# Patient Record
Sex: Female | Born: 1935 | Race: White | Hispanic: No | Smoking: Never smoker
Health system: Southern US, Community
[De-identification: ages and names within clinical notes are randomized; demographics above are authoritative.]

## PROBLEM LIST (undated history)

## (undated) DIAGNOSIS — C801 Malignant (primary) neoplasm, unspecified: Secondary | ICD-10-CM

## (undated) DIAGNOSIS — B019 Varicella without complication: Secondary | ICD-10-CM

## (undated) DIAGNOSIS — K219 Gastro-esophageal reflux disease without esophagitis: Secondary | ICD-10-CM

## (undated) DIAGNOSIS — C4490 Unspecified malignant neoplasm of skin, unspecified: Secondary | ICD-10-CM

## (undated) HISTORY — PX: OTHER SURGICAL HISTORY: SHX169

---

## 1996-01-28 HISTORY — PX: BREAST BIOPSY: SHX20

## 2004-02-02 ENCOUNTER — Ambulatory Visit: Payer: Self-pay | Admitting: Internal Medicine

## 2005-02-13 ENCOUNTER — Ambulatory Visit: Payer: Self-pay | Admitting: Internal Medicine

## 2006-02-16 ENCOUNTER — Ambulatory Visit: Payer: Self-pay | Admitting: Internal Medicine

## 2007-02-18 ENCOUNTER — Ambulatory Visit: Payer: Self-pay | Admitting: Internal Medicine

## 2007-03-26 ENCOUNTER — Ambulatory Visit: Payer: Self-pay | Admitting: Unknown Physician Specialty

## 2007-05-19 ENCOUNTER — Ambulatory Visit: Payer: Self-pay

## 2007-06-18 ENCOUNTER — Other Ambulatory Visit: Payer: Self-pay

## 2007-06-18 ENCOUNTER — Ambulatory Visit: Payer: Self-pay | Admitting: General Practice

## 2007-06-30 ENCOUNTER — Ambulatory Visit: Payer: Self-pay | Admitting: General Practice

## 2008-02-21 ENCOUNTER — Ambulatory Visit: Payer: Self-pay | Admitting: Internal Medicine

## 2009-02-21 ENCOUNTER — Ambulatory Visit: Payer: Self-pay | Admitting: Internal Medicine

## 2010-02-22 ENCOUNTER — Ambulatory Visit: Payer: Self-pay | Admitting: Internal Medicine

## 2010-03-07 ENCOUNTER — Ambulatory Visit: Payer: Self-pay | Admitting: Ophthalmology

## 2010-04-02 ENCOUNTER — Ambulatory Visit: Payer: Self-pay | Admitting: Ophthalmology

## 2011-03-04 ENCOUNTER — Ambulatory Visit: Payer: Self-pay | Admitting: Internal Medicine

## 2011-04-21 ENCOUNTER — Ambulatory Visit: Payer: Self-pay | Admitting: General Practice

## 2011-04-21 LAB — URINALYSIS, COMPLETE
Bilirubin,UR: NEGATIVE
Blood: NEGATIVE
Glucose,UR: NEGATIVE mg/dL (ref 0–75)
Leukocyte Esterase: NEGATIVE
Nitrite: NEGATIVE
Ph: 5 (ref 4.5–8.0)
Protein: NEGATIVE

## 2011-04-21 LAB — BASIC METABOLIC PANEL
BUN: 15 mg/dL (ref 7–18)
Calcium, Total: 9.5 mg/dL (ref 8.5–10.1)
Co2: 28 mmol/L (ref 21–32)
EGFR (African American): >60
EGFR (Non-African Amer.): >60
Glucose: 100 mg/dL — ABNORMAL HIGH (ref 65–99)
Osmolality: 288 (ref 275–301)
Sodium: 144 mmol/L (ref 136–145)

## 2011-04-21 LAB — CBC
HCT: 40.9 % (ref 35.0–47.0)
MCV: 93 fL (ref 80–100)
Platelet: 228 10*3/uL (ref 150–440)
RBC: 4.4 10*6/uL (ref 3.80–5.20)
RDW: 13.6 % (ref 11.5–14.5)

## 2011-04-21 LAB — PROTIME-INR: INR: 0.9

## 2011-04-21 LAB — SEDIMENTATION RATE: Erythrocyte Sed Rate: 17 mm/hr (ref 0–30)

## 2011-04-21 LAB — MRSA PCR SCREENING

## 2011-04-21 LAB — APTT: Activated PTT: 25.5 secs (ref 23.6–35.9)

## 2011-04-23 LAB — URINE CULTURE

## 2011-05-07 ENCOUNTER — Inpatient Hospital Stay: Payer: Self-pay | Admitting: General Practice

## 2011-05-08 LAB — BASIC METABOLIC PANEL
Anion Gap: 7 (ref 7–16)
Calcium, Total: 8.1 mg/dL — ABNORMAL LOW (ref 8.5–10.1)
Chloride: 109 mmol/L — ABNORMAL HIGH (ref 98–107)
Co2: 27 mmol/L (ref 21–32)
EGFR (African American): >60
EGFR (Non-African Amer.): >60
Glucose: 107 mg/dL — ABNORMAL HIGH (ref 65–99)
Potassium: 3.9 mmol/L (ref 3.5–5.1)
Sodium: 143 mmol/L (ref 136–145)

## 2011-05-08 LAB — PLATELET COUNT: Platelet: 191 10*3/uL (ref 150–440)

## 2011-05-09 LAB — BASIC METABOLIC PANEL
Anion Gap: 8 (ref 7–16)
BUN: 7 mg/dL (ref 7–18)
Calcium, Total: 8.3 mg/dL — ABNORMAL LOW (ref 8.5–10.1)
Chloride: 107 mmol/L (ref 98–107)
Co2: 26 mmol/L (ref 21–32)
Creatinine: 0.57 mg/dL — ABNORMAL LOW (ref 0.60–1.30)
EGFR (African American): >60
Osmolality: 280 (ref 275–301)
Sodium: 141 mmol/L (ref 136–145)

## 2012-03-04 ENCOUNTER — Ambulatory Visit: Payer: Self-pay | Admitting: Internal Medicine

## 2012-08-04 ENCOUNTER — Ambulatory Visit: Payer: Self-pay | Admitting: Urology

## 2013-03-21 ENCOUNTER — Ambulatory Visit: Payer: Self-pay | Admitting: Internal Medicine

## 2014-03-22 DIAGNOSIS — I10 Essential (primary) hypertension: Secondary | ICD-10-CM | POA: Insufficient documentation

## 2014-03-23 ENCOUNTER — Ambulatory Visit: Payer: Self-pay | Admitting: Internal Medicine

## 2014-05-21 NOTE — Discharge Summary (Signed)
PATIENT NAME:  Vickie Chaney, Vickie Chaney MR#:  [id number] DATE OF BIRTH:  [birth year]  DATE OF ADMISSION:  05/07/2011 DATE OF DISCHARGE:  05/10/2011  ADMITTING DIAGNOSIS: Degenerative arthrosis of the left knee.   DISCHARGE DIAGNOSIS: Degenerative arthrosis of the left knee.   HISTORY: The patient is a pleasant 80 year old who has been followed at [facility] for progression of left knee pain. She had reported a long history of left knee pain. The patient had previously underwent a left knee arthroscopy for partial medial meniscectomy and chondroplasty of the medial femoral condyle in June 2009. 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 The patient stated that the pain had greatly increased in severity. She had localized most of the pain along the medial aspect of the knee. Her pain was noted to be aggravated with weight-bearing activities. She was noted to ambulate with a limp at times. At the time of surgery, she was not using any ambulatory aid. The patient had not seen any significant improvement in her condition despite activity modification, use of any nonsteroidal anti-inflammatory medications, and intraarticular cortisone injections. The pain had progressed to the point that it was significantly interfering with her activities of daily living. X-rays taken in the [facility] showed narrowing of the medial cartilage space with associated varus alignment. She was noted to have osteophyte as well as subchondral sclerosis. After discussion of the risks and benefits of surgical intervention, the patient expressed her understanding of the risks and benefits and agreed with plans for surgical intervention.   PROCEDURE: Left total knee arthroplasty using computer-assisted navigation.   ANESTHESIA: Femoral nerve block with spinal.   SOFT TISSUE RELEASE: Anterior cruciate ligament, posterior cruciate ligament, deep  medial collateral ligaments, as well as the patellofemoral ligament.   IMPLANTS UTILIZED: DePuy PFC Sigma size 4 narrow posterior stabilized femoral component (cemented), size 3 tibial component (cemented), 35 mm three pegged oval dome patella (cemented), and a 10 mm stabilized rotating platform polyethylene insert.   HOSPITAL COURSE: The patient tolerated the procedure very well. She had no complications. She was then taken to the PAC-U where she was stabilized and then transferred to the orthopedic floor. The patient began receiving anticoagulation therapy of Lovenox 30 mg subcutaneous every 12 hours per anesthesia and pharmacy protocol. She was fitted with TED stockings bilaterally. These were allowed to be removed one hour per eight hour shift. She was also fitted with the AV-I compression foot pumps set at 80 mmHg. Her calves have been nontender and free of any evidence of any deep venous thromboses. Negative Homans sign. Heels were elevated off the bed using rolled towels.   The patient has denied any chest pain or shortness of breath. She has been afebrile. Hemodynamically she was stable and no transfusions were given other than the Autovac transfusions given the first six hours postoperatively.   Physical therapy was initiated on day one for gait training and transfers. The patient was ambulating greater than 200 feet upon discharge. She was able to go up and down four sets of steps. She was independent with bed to chair transfers. Occupational therapy was also initiated on day one for activities of daily living and assistive devices. No complications were encountered during the hospital course.   The patient's IV, Foley and Hemovac were all discontinued on day two along with a dressing change. The Polar Care was reapplied to the surgical leg maintaining a temperature of  40 to 50 degrees Fahrenheit. The wound has been free of any drainage or any signs of infection. The patient was able to do  straight leg raises on her own.   The patient was discharged to home in improved stable condition.   DISCHARGE INSTRUCTIONS:  1. She is to weight bear as tolerated.  2. Continue using TED stockings. These are to be worn during the day but may be removed at night.  3. She will receive home health physical therapy. Continue using a walker until cleared by physical therapy to go to a quad cane.  4. She is placed on a regular diet.  5. She has a follow-up appointment in the clinic in two weeks. She is to call the clinic sooner if any temperatures of 101.5 or excessive bleeding.  6. She is to resume her regular medication that she was on prior to admission. She was given a prescription for Lovenox 40 mg subcutaneously daily for 14 days then discontinue and begin taking one 81 mg enteric-coated aspirin per day. She was also given a prescription for oxycodone 5 to 10 mg every 4 to 6 hours p.r.n. for pain.  7. Continue using Polar Care maintaining a temperature of 40 to 50 feet degrees Fahrenheit.   PAST MEDICAL HISTORY:  1. Chickenpox. 2. Arthritis of the feet. 3. Seasonal allergies.  4. Hypercholesterolemia.  5. Gastroesophageal reflux disease.  ____________________________ Vance Peper, PA jrw:slb D: 05/18/2011 20:18:21 ET T: 05/19/2011 10:49:02 ET JOB#: [id number]  cc: Vance Peper, PA, <Dictator> Narely Nobles PA ELECTRONICALLY SIGNED 05/19/2011 20:33

## 2014-05-21 NOTE — Op Note (Signed)
PATIENT NAME:  Vickie Chaney, Vickie Chaney MR#:  [id number] DATE OF BIRTH:  [birth year]  DATE OF PROCEDURE:  05/07/2011  PREOPERATIVE DIAGNOSIS: Degenerative arthrosis of the left knee.   POSTOPERATIVE DIAGNOSIS: Degenerative arthrosis of the left knee.   PROCEDURE PERFORMED: Left total knee arthroplasty using computer-assisted navigation.   SURGEON: Laurice Record. Holley Bouche., MD  ASSISTANT: Vance Peper, PA-C (required to maintain retraction throughout the procedure)   ANESTHESIA: Femoral nerve block and spinal.   ESTIMATED BLOOD LOSS: 50 mL.   FLUIDS REPLACED: 2000 mL of crystalloid.   TOURNIQUET TIME: 71 minutes.   DRAINS: Two medium drains to reinfusion system.   SOFT TISSUE RELEASES: Anterior cruciate ligament, posterior cruciate ligament, deep medial collateral ligament, and patellofemoral ligament.   IMPLANTS UTILIZED: DePuy PFC Sigma size 4N (narrow) posterior stabilized femoral component (cemented), size 3 tibial component (cemented), 35 mm three peg oval dome patella (cemented), and a 10 mm stabilized rotating platform polyethylene insert.   INDICATIONS FOR SURGERY: The patient is a 80 year old female who has been seen for complaints of progressive left knee pain. X-rays demonstrated severe degenerative changes in tricompartmental fashion with relative varus deformity. After discussion of the risks and benefits of surgical intervention, patient expressed her understanding of the risks and benefits and agreed with plans for surgical intervention.   PROCEDURE IN DETAIL: The patient was brought into the Operating Room and, after adequate femoral nerve block and spinal anesthesia was achieved, a tourniquet was placed on patient's upper left thigh. Patient's left knee and leg were cleaned and prepped with alcohol and DuraPrep, draped in the usual sterile fashion. A "timeout" was performed as per usual protocol. The left lower extremity was exsanguinated using Esmarch, and tourniquet was inflated to 300  mmHg. Anterior longitudinal incision was made followed by standard mid vastus approach. A moderate effusion was evacuated. Deep fibers of the medial collateral ligament were elevated in subperiosteal fashion off the medial flare of the tibia so as to maintain a continuous soft tissue sleeve. Patella was subluxed laterally and the patellofemoral ligament was incised. Inspection of the knee demonstrated severe degenerative changes in tricompartmental fashion. Prominent osteophytes were debrided using rongeur. Anterior and posterior cruciate ligaments were excised. Two 4.0 mm Schanz pins were inserted into the femur and into the tibia for attachment of the array of spheres used for computer-assisted navigation. Hip center was identified using circumduction technique. Distal landmarks were mapped using computer. Distal femur and proximal tibia were mapped using the computer. Distal femoral cutting guide was positioned using computer-assisted navigation so as to achieve a 5 degrees distal valgus cut. Cut was performed and verified using computer. Distal femur was sized and it was felt that a size 4N (narrow) posterior stabilized femoral component was appropriate. A size 4 cutting guide was positioned using computer-assisted navigation and the anterior cut was performed. This was followed by completion of the posterior and chamfer cuts. Femoral cutting guide for the central box was then positioned and central box cut was performed.   Attention was then directed to the proximal tibia. Medial and lateral menisci were excised. The extramedullary tibial cutting guide was positioned using computer-assisted navigation so as to achieve 0 degrees varus valgus alignment and 0 degrees posterior slope. Cut was performed and verified using computer. Proximal tibia was sized and it was felt that a size 3 tibial tray was appropriate. Tibial and femoral trials were inserted followed by insertion of a 10 mm polyethylene trial. This  allowed for excellent mediolateral soft tissue balancing  both in full extension and in 90 degrees of flexion. Finally, patella was cut and prepared so as to accommodate a 35 mm three peg oval dome patella. Patellar trial was placed and the knee was placed through a range of motion with excellent patellar tracking appreciated.   Femoral trial was removed. Central post hole for the tibial component was reamed followed by insertion of a keel punch. Tibial trial was then removed. Cut surfaces of bone were irrigated with copious amounts of normal saline with antibiotic solution using pulsatile lavage and then suctioned dry. Polymethyl methacrylate cement was prepared in the usual fashion using a vacuum mixer. Cement was applied to the cut surface of the proximal tibia as well as along the undersurface of a size 3 MBT tibial component. Tibial component was positioned and impacted into place. Excess cement was removed using freer elevators. Cement was then applied to the cut surface of the femur as well as along the posterior flanges of size 4N (narrow) posterior stabilized femoral component. Femoral component was positioned and impacted into place. Excess cement was removed using freer elevators. A 10 mm stabilized rotating platform polyethylene insert was inserted and the knee was brought into full extension with steady axial compression applied. Finally, cement was applied to the backside of a 35 mm three peg oval dome patella and patellar component was positioned and patellar clamp applied. Excess cement was removed using freer elevators.   After adequate curing of cement, tourniquet was deflated after total tourniquet time of 71 minutes. Hemostasis was achieved using electrocautery. The knee was irrigated with copious amounts of normal saline with antibiotic solution using pulsatile lavage and then suctioned dry. The knee was inspected for any residual cement debris. A total of 30 mL of 0.25% Marcaine with  epinephrine was injected along the posterior capsule. A 10 mm stabilized rotating platform polyethylene insert was inserted and the knee was placed through a range of motion. Excellent mediolateral soft tissue balancing was appreciated both in full extension and in 90 degrees of flexion. Excellent patellar tracking was appreciated. Two medium drains were placed in the wound bed and brought out through a separate stab incision to be attached to a reinfusion system. The medial parapatellar portion of the incision was reapproximated using interrupted sutures of #1 Vicryl. Subcutaneous tissue was approximated in layers using first #0 Vicryl followed by 2-0 Vicryl. The skin was closed with skin staples. A sterile dressing was applied.   Patient tolerated procedure well. She was transported to the recovery room in stable condition.   ____________________________ Laurice Record. Holley Bouche., MD jph:cms D: 05/07/2011 95:18:84 ET T: 05/08/2011 10:30:15 ET JOB#: [id number]  cc: Jeneen Rinks P. Holley Bouche., MD, <Dictator> JAMES P Holley Bouche MD ELECTRONICALLY SIGNED 05/08/2011 22:40

## 2015-04-23 ENCOUNTER — Other Ambulatory Visit: Payer: Self-pay | Admitting: Internal Medicine

## 2015-04-23 DIAGNOSIS — Z1231 Encounter for screening mammogram for malignant neoplasm of breast: Secondary | ICD-10-CM

## 2015-04-24 ENCOUNTER — Ambulatory Visit
Admission: RE | Admit: 2015-04-24 | Discharge: 2015-04-24 | Disposition: A | Discharge status: Home / Self Care | Payer: Medicare Other | Source: Ambulatory Visit | Attending: Internal Medicine | Admitting: Internal Medicine

## 2015-04-24 ENCOUNTER — Other Ambulatory Visit: Payer: Self-pay | Admitting: Internal Medicine

## 2015-04-24 DIAGNOSIS — Z1231 Encounter for screening mammogram for malignant neoplasm of breast: Secondary | ICD-10-CM | POA: Insufficient documentation

## 2015-04-24 HISTORY — DX: Malignant (primary) neoplasm, unspecified: C80.1

## 2015-05-23 ENCOUNTER — Other Ambulatory Visit: Payer: Self-pay | Admitting: Cardiology

## 2015-05-30 ENCOUNTER — Encounter: Payer: Self-pay | Admitting: Registered Nurse

## 2015-05-30 ENCOUNTER — Encounter
Admission: RE | Disposition: A | Discharge status: Home / Self Care | Payer: Self-pay | Source: Ambulatory Visit | Attending: Cardiology

## 2015-05-30 ENCOUNTER — Encounter: Payer: Self-pay | Admitting: *Deleted

## 2015-05-30 ENCOUNTER — Ambulatory Visit
Admission: RE | Admit: 2015-05-30 | Discharge: 2015-05-30 | Disposition: A | Discharge status: Home / Self Care | Payer: Medicare Other | Source: Ambulatory Visit | Attending: Cardiology | Admitting: Cardiology

## 2015-05-30 DIAGNOSIS — Z7951 Long term (current) use of inhaled steroids: Secondary | ICD-10-CM | POA: Insufficient documentation

## 2015-05-30 DIAGNOSIS — I25110 Atherosclerotic heart disease of native coronary artery with unstable angina pectoris: Secondary | ICD-10-CM | POA: Insufficient documentation

## 2015-05-30 DIAGNOSIS — Z79899 Other long term (current) drug therapy: Secondary | ICD-10-CM | POA: Diagnosis not present

## 2015-05-30 DIAGNOSIS — I081 Rheumatic disorders of both mitral and tricuspid valves: Secondary | ICD-10-CM | POA: Insufficient documentation

## 2015-05-30 DIAGNOSIS — Z823 Family history of stroke: Secondary | ICD-10-CM | POA: Diagnosis not present

## 2015-05-30 DIAGNOSIS — Z7982 Long term (current) use of aspirin: Secondary | ICD-10-CM | POA: Insufficient documentation

## 2015-05-30 DIAGNOSIS — Z825 Family history of asthma and other chronic lower respiratory diseases: Secondary | ICD-10-CM | POA: Diagnosis not present

## 2015-05-30 DIAGNOSIS — Z801 Family history of malignant neoplasm of trachea, bronchus and lung: Secondary | ICD-10-CM | POA: Insufficient documentation

## 2015-05-30 DIAGNOSIS — Z90710 Acquired absence of both cervix and uterus: Secondary | ICD-10-CM | POA: Insufficient documentation

## 2015-05-30 DIAGNOSIS — K219 Gastro-esophageal reflux disease without esophagitis: Secondary | ICD-10-CM | POA: Diagnosis not present

## 2015-05-30 DIAGNOSIS — R0602 Shortness of breath: Secondary | ICD-10-CM | POA: Diagnosis present

## 2015-05-30 HISTORY — DX: Gastro-esophageal reflux disease without esophagitis: K21.9

## 2015-05-30 HISTORY — DX: Varicella without complication: B01.9

## 2015-05-30 HISTORY — DX: Unspecified malignant neoplasm of skin, unspecified: C44.90

## 2015-05-30 HISTORY — PX: CARDIAC CATHETERIZATION: SHX172

## 2015-05-30 SURGERY — LEFT HEART CATH AND CORONARY ANGIOGRAPHY
Anesthesia: Moderate Sedation | Laterality: Left

## 2015-05-30 MED ORDER — SODIUM CHLORIDE 0.9 % IV SOLN: 250.0000 mL | INTRAVENOUS | Status: DC | PRN

## 2015-05-30 MED ORDER — ONDANSETRON HCL 4 MG/2ML IJ SOLN: 4.0000 mg | Freq: Four times a day (QID) | INTRAMUSCULAR | Status: DC | PRN

## 2015-05-30 MED ORDER — ASPIRIN 81 MG PO CHEW
81.0000 mg | CHEWABLE_TABLET | ORAL | Status: AC
  Administered 2015-05-30: 81 mg via ORAL

## 2015-05-30 MED ORDER — HEPARIN SOD (PORK) LOCK FLUSH 10 UNIT/ML IV SOLN
10.0000 [IU] | Freq: Once | INTRAVENOUS | Status: AC
  Administered 2015-05-30: 10 [IU] via INTRAVENOUS

## 2015-05-30 MED ORDER — MIDAZOLAM HCL 2 MG/2ML IJ SOLN
INTRAMUSCULAR | Status: DC | PRN
  Administered 2015-05-30: 1 mg via INTRAVENOUS

## 2015-05-30 MED ORDER — FENTANYL CITRATE (PF) 100 MCG/2ML IJ SOLN
INTRAMUSCULAR | Status: AC
  Filled 2015-05-30: qty 2

## 2015-05-30 MED ORDER — SODIUM CHLORIDE 0.9 % IV SOLN
INTRAVENOUS | Status: DC
  Administered 2015-05-30: 07:00:00 via INTRAVENOUS

## 2015-05-30 MED ORDER — SODIUM CHLORIDE 0.9% FLUSH: 3.0000 mL | INTRAVENOUS | Status: DC | PRN

## 2015-05-30 MED ORDER — ASPIRIN 81 MG PO CHEW
CHEWABLE_TABLET | ORAL | Status: AC
  Filled 2015-05-30: qty 1

## 2015-05-30 MED ORDER — SODIUM CHLORIDE 0.9% FLUSH: 3.0000 mL | Freq: Two times a day (BID) | INTRAVENOUS | Status: DC

## 2015-05-30 MED ORDER — HEPARIN (PORCINE) IN NACL 2-0.9 UNIT/ML-% IJ SOLN
INTRAMUSCULAR | Status: AC
  Filled 2015-05-30: qty 1000

## 2015-05-30 MED ORDER — SODIUM CHLORIDE 0.9 % WEIGHT BASED INFUSION: 3.0000 mL/kg/h | INTRAVENOUS | Status: DC

## 2015-05-30 MED ORDER — ACETAMINOPHEN 325 MG PO TABS: 650.0000 mg | ORAL_TABLET | ORAL | Status: DC | PRN

## 2015-05-30 MED ORDER — FENTANYL CITRATE (PF) 100 MCG/2ML IJ SOLN
INTRAMUSCULAR | Status: DC | PRN
  Administered 2015-05-30: 25 ug via INTRAVENOUS

## 2015-05-30 MED ORDER — MIDAZOLAM HCL 2 MG/2ML IJ SOLN
INTRAMUSCULAR | Status: AC
  Filled 2015-05-30: qty 2

## 2015-05-30 MED ORDER — IOPAMIDOL (ISOVUE-300) INJECTION 61%
INTRAVENOUS | Status: DC | PRN
  Administered 2015-05-30: 95 mL via INTRA_ARTERIAL

## 2015-05-30 SURGICAL SUPPLY — 10 items
CATH INFINITI 5FR ANG PIGTAIL (CATHETERS) ×3 IMPLANT
CATH INFINITI 5FR JL4 (CATHETERS) ×3 IMPLANT
CATH INFINITI JR4 5F (CATHETERS) ×3 IMPLANT
DEVICE CLOSURE MYNXGRIP 5F (Vascular Products) ×3 IMPLANT
KIT MANI 3VAL PERCEP (MISCELLANEOUS) ×3 IMPLANT
NEEDLE PERC 18GX7CM (NEEDLE) ×3 IMPLANT
PACK CARDIAC CATH (CUSTOM PROCEDURE TRAY) ×3 IMPLANT
SHEATH AVANTI 5FR X 11CM (SHEATH) ×3 IMPLANT
SHEATH PINNACLE 5F 10CM (SHEATH) IMPLANT
WIRE EMERALD 3MM-J .035X150CM (WIRE) ×6 IMPLANT

## 2015-05-30 NOTE — H&P (Signed)
Chief Complaint: Chief Complaint  Patient presents with  . 6 month follow up  sob is a little worse but is seeing dr Raul Del  Date of Service: 05/23/2015 Date of Birth: 09/25/1934 PCP: Vickie Brunner, MD  History of Present Illness: Vickie Chaney is a 81 y.o.female patient who is referred for evaluation of shortness of breath and chest discomfort. Patient states that still has exertional shortness of breath and chest burning. She was seen by her pulmonologist who ambulated her with no drop in her pulse ox. She underwent a functional study which revealed no ischemia with normal LV function but had symptoms with stress. Echocardiogram revealed normal LV mild MR and mild TR. Patient was exposed to secondhand smoke for some time with her husband. She underwent pulmonary function tests which revealed mild obstructive disease. She is on bronchodilators with minimal improvement. Her symptoms persist point where she has to limit her activity due to chest burning.  Past Medical and Surgical History  Past Medical History Past Medical History  Diagnosis Date  . Allergic state  . Chickenpox  . GERD (gastroesophageal reflux disease)   Past Surgical History She has a past surgical history that includes Hysterectomy (1976); Bladder suspension (1996); Toe Surgery (1996); REMOVAL OF EXOSTOSIS AND ARTHRITIC BONE AND SPURRING FROM THE DORSUM OF THE LEFT 1ST AND 2ND MET CUNEIFORM JOINTS (Left, 09/2008); Joint replacement (Left, 05/07/2011); and Knee arthroscopy (Left, 06/30/2007).   Medications and Allergies  Current Medications  Current Outpatient Prescriptions  Medication Sig Dispense Refill  . albuterol 90 mcg/actuation inhaler Inhale 2 inhalations into the lungs every 6 (six) hours as needed for Wheezing. 1 Inhaler 12  . aspirin 81 MG EC tablet Take 81 mg by mouth once daily.  . budesonide (PULMICORT) 180 mcg/actuation inhaler Inhale 1 inhalation into the lungs 2 (two) times daily.  . cyanocobalamin,  vitamin B-12, 2,500 mcg Tab Take 1 tablet by mouth once daily.  . fluticasone (FLONASE) 50 mcg/actuation nasal spray Place 2 sprays into both nostrils once daily. 48 g 3  . hydroCHLOROthiazide (HYDRODIURIL) 12.5 MG tablet TAKE 1 TABLET DAILY 90 tablet 1  . MULTIVITAMIN/IRON/FOLIC ACID (CENTRUM ULTRA WOMEN'S ORAL) Take by mouth once daily. Reported on 04/19/2015  . omeprazole (PRILOSEC) 20 MG DR capsule Take 1 capsule (20 mg total) by mouth once daily. 90 capsule 3  . simvastatin (ZOCOR) 10 MG tablet TAKE 1 TABLET AT BEDTIME 90 tablet 3   No current facility-administered medications for this visit.   Allergies: Review of patient's allergies indicates no known allergies.  Social and Family History  Social History reports that she has never smoked. She has never used smokeless tobacco. She reports that she does not drink alcohol or use illicit drugs.  Family History Family History  Problem Relation Age of Onset  . Emphysema Father  . Lung cancer Father  . Stroke Mother  Mini-stroke   Review of Systems  Review of Systems  Constitutional: Negative for chills, diaphoresis, fever, malaise/fatigue and weight loss.  HENT: Negative for congestion, ear discharge, hearing loss and tinnitus.  Eyes: Negative for blurred vision.  Respiratory: Positive for shortness of breath. Negative for cough, hemoptysis, sputum production and wheezing.  Cardiovascular: Positive for chest pain. Negative for palpitations, orthopnea, claudication, leg swelling and PND.  Gastrointestinal: Negative for abdominal pain, blood in stool, constipation, diarrhea, heartburn, melena, nausea and vomiting.  Genitourinary: Negative for dysuria, frequency, hematuria and urgency.  Musculoskeletal: Negative for back pain, falls, joint pain and myalgias.  Skin: Negative  for itching and rash.  Neurological: Negative for dizziness, tingling, focal weakness, loss of consciousness, weakness and headaches.  Endo/Heme/Allergies:  Negative for polydipsia. Does not bruise/bleed easily.  Psychiatric/Behavioral: Negative for depression, memory loss and substance abuse. The patient is not nervous/anxious.   Physical Examination   Vitals: Visit Vitals  . BP 140/84  . Pulse 80  . Resp 12  . Ht 157.5 cm ('5\' 2"' )  . Wt 76.7 kg (169 lb)  . BMI 30.91 kg/m2   Ht:157.5 cm ('5\' 2"' ) Wt:76.7 kg (169 lb) YJE:HUDJ surface area is 1.83 meters squared. Body mass index is 30.91 kg/(m^2).  Wt Readings from Last 3 Encounters:  05/23/15 76.7 kg (169 lb)  05/04/15 77.1 kg (170 lb)  04/19/15 76.7 kg (169 lb)   BP Readings from Last 3 Encounters:  05/23/15 140/84  05/04/15 121/73  04/19/15 142/86   General appearance appears in no acute distress  Head Mouth and Eye exam Normocephalic, without obvious abnormality, atraumatic Dentition is good Eyes appear anicteric   Neck exam Thyroid: normal  Nodes: no obvious adenopathy  LUNGS Breath Sounds: Normal Percussion: Normal  CARDIOVASCULAR JVP CV wave: no HJR: no Elevation at 90 degrees: None Carotid Pulse: normal pulsation bilaterally Bruit: None Apex: apical impulse normal  Auscultation Rhythm: normal sinus rhythm S1: normal S2: normal Clicks: no Rub: no Murmurs: no murmurs  Gallop: None ABDOMEN Liver enlargement: no Pulsatile aorta: no Ascites: no Bruits: no  EXTREMITIES Clubbing: no Edema: trace to 1+ bilateral pedal edema Pulses: peripheral pulses symmetrical Femoral Bruits: no Amputation: no SKIN Rash: no Cyanosis: no Embolic phemonenon: no Bruising: no NEURO Alert and Oriented to person, place and time: yes Non focal: yes  PSYCH: Pt appears to have normal affect  LABS REVIEWED Last 3 CBC results: Lab Results  Component Value Date  WBC 7.2 04/19/2015  WBC 6.4 03/15/2014   Lab Results  Component Value Date  HGB 14.2 04/19/2015  HGB 14.7 03/15/2014   Lab Results  Component Value Date  HCT 41.8 04/19/2015  HCT 42.1  03/15/2014   Lab Results  Component Value Date  PLT 280 04/19/2015  PLT 246 03/15/2014   Lab Results  Component Value Date  CREATININE 0.8 04/19/2015  BUN 16 04/19/2015  NA 143 04/19/2015  K 4.7 04/19/2015  CL 104 04/19/2015  CO2 31.6 04/19/2015   Lab Results  Component Value Date  HDL 49.9 04/19/2015  HDL 53.8 03/15/2014   Lab Results  Component Value Date  LDLCALC 93 04/19/2015  LDLCALC 104 03/15/2014   Lab Results  Component Value Date  TRIG 100 04/19/2015  TRIG 86 03/15/2014   Lab Results  Component Value Date  ALT 15 04/19/2015  AST 17 04/19/2015  ALKPHOS 47 04/19/2015   Lab Results  Component Value Date  TSH 1.270 04/19/2015   Assessment and Plan   81 y.o. female with  ICD-10-CM ICD-9-CM  1. SOB (shortness of breath) shortness of breath etiology is unclear. Patient underwent a functional study an echocardiogram with no significant abnormalities. Due to persistent exertional shortness of breath and exertional chest burning will need to proceed with left heart cath to evaluate coronary anatomy. Her pulmonary pressures by echo were normal. Further recommendations after cardiac catheterization. Reason for cath is persistent exertional chest pain consistent with unstable angina despite negative functional study. R06.02 786.05      2. Atypical chest pain-no evidence of ischemia on functional study. R07.89 786.59     3. High cholesterol-continue with simvastatin at 10 mg daily  with an LDL goal of less than 100 E78.0 272.0  4. Essential hypertension-low-sodium diet daily exercise as tolerated. I10 401.9  5. Gastroesophageal reflux disease, esophagitis presence not specified-continue with an acids and consider GI evaluation K21.9 530.81   Return in about 2 weeks (around 06/06/2015).  These notes generated with voice recognition software. I apologize for typographical errors.  Sydnee Levans, MD

## 2015-05-30 NOTE — Discharge Instructions (Signed)
Angiogram, Care After °Refer to this sheet in the next few weeks. These instructions provide you with information about caring for yourself after your procedure. Your health care provider may also give you more specific instructions. Your treatment has been planned according to current medical practices, but problems sometimes occur. Call your health care provider if you have any problems or questions after your procedure. °WHAT TO EXPECT AFTER THE PROCEDURE °After your procedure, it is typical to have the following: °· Bruising at the catheter insertion site that usually fades within 1-2 weeks. °· Blood collecting in the tissue (hematoma) that may be painful to the touch. It should usually decrease in size and tenderness within 1-2 weeks. °HOME CARE INSTRUCTIONS °· Take medicines only as directed by your health care provider. °· You may shower 24-48 hours after the procedure or as directed by your health care provider. Remove the bandage (dressing) and gently wash the site with plain soap and water. Pat the area dry with a clean towel. Do not rub the site, because this may cause bleeding. °· Do not take baths, swim, or use a hot tub until your health care provider approves. °· Check your insertion site every day for redness, swelling, or drainage. °· Do not apply powder or lotion to the site. °· Do not lift over 10 lb (4.5 kg) for 5 days after your procedure or as directed by your health care provider. °· Ask your health care provider when it is okay to: °¨ Return to work or school. °¨ Resume usual physical activities or sports. °¨ Resume sexual activity. °· Do not drive home if you are discharged the same day as the procedure. Have someone else drive you. °· You may drive 24 hours after the procedure unless otherwise instructed by your health care provider. °· Do not operate machinery or power tools for 24 hours after the procedure or as directed by your health care provider. °· If your procedure was done as an  outpatient procedure, which means that you went home the same day as your procedure, a responsible adult should be with you for the first 24 hours after you arrive home. °· Keep all follow-up visits as directed by your health care provider. This is important. °SEEK MEDICAL CARE IF: °· You have a fever. °· You have chills. °· You have increased bleeding from the catheter insertion site. Hold pressure on the site. °SEEK IMMEDIATE MEDICAL CARE IF: °· You have unusual pain at the catheter insertion site. °· You have redness, warmth, or swelling at the catheter insertion site. °· You have drainage (other than a small amount of blood on the dressing) from the catheter insertion site. °· The catheter insertion site is bleeding, and the bleeding does not stop after 30 minutes of holding steady pressure on the site. °· The area near or just beyond the catheter insertion site becomes pale, cool, tingly, or numb. °  °This information is not intended to replace advice given to you by your health care provider. Make sure you discuss any questions you have with your health care provider. °  °Document Released: 08/01/2004 Document Revised: 02/03/2014 Document Reviewed: 06/16/2012 °Elsevier Interactive Patient Education ©2016 [employer] ° °

## 2015-05-31 ENCOUNTER — Encounter: Payer: Self-pay | Admitting: Cardiology

## 2015-06-20 ENCOUNTER — Other Ambulatory Visit: Payer: Self-pay | Admitting: Physician Assistant

## 2015-06-20 DIAGNOSIS — R1319 Other dysphagia: Secondary | ICD-10-CM

## 2015-06-29 ENCOUNTER — Ambulatory Visit
Admission: RE | Admit: 2015-06-29 | Discharge: 2015-06-29 | Disposition: A | Discharge status: Home / Self Care | Payer: Medicare Other | Source: Ambulatory Visit | Attending: Physician Assistant | Admitting: Physician Assistant

## 2015-06-29 DIAGNOSIS — R1319 Other dysphagia: Secondary | ICD-10-CM | POA: Diagnosis not present

## 2016-03-13 ENCOUNTER — Other Ambulatory Visit: Payer: Self-pay | Admitting: Internal Medicine

## 2016-03-13 DIAGNOSIS — Z1231 Encounter for screening mammogram for malignant neoplasm of breast: Secondary | ICD-10-CM

## 2016-04-24 ENCOUNTER — Ambulatory Visit
Admission: RE | Admit: 2016-04-24 | Discharge: 2016-04-24 | Disposition: A | Discharge status: Home / Self Care | Payer: Medicare Other | Source: Ambulatory Visit | Attending: Internal Medicine | Admitting: Internal Medicine

## 2016-04-24 DIAGNOSIS — Z1231 Encounter for screening mammogram for malignant neoplasm of breast: Secondary | ICD-10-CM | POA: Diagnosis not present

## 2017-03-19 ENCOUNTER — Other Ambulatory Visit: Payer: Self-pay | Admitting: Internal Medicine

## 2017-03-19 DIAGNOSIS — Z1231 Encounter for screening mammogram for malignant neoplasm of breast: Secondary | ICD-10-CM

## 2017-04-27 ENCOUNTER — Ambulatory Visit
Admission: RE | Admit: 2017-04-27 | Discharge: 2017-04-27 | Disposition: A | Discharge status: Home / Self Care | Payer: Medicare Other | Source: Ambulatory Visit | Attending: Internal Medicine | Admitting: Internal Medicine

## 2017-04-27 DIAGNOSIS — Z1231 Encounter for screening mammogram for malignant neoplasm of breast: Secondary | ICD-10-CM | POA: Diagnosis not present

## 2018-03-23 ENCOUNTER — Other Ambulatory Visit: Payer: Self-pay | Admitting: Internal Medicine

## 2018-03-23 DIAGNOSIS — Z1231 Encounter for screening mammogram for malignant neoplasm of breast: Secondary | ICD-10-CM

## 2018-06-16 ENCOUNTER — Ambulatory Visit
Admission: RE | Admit: 2018-06-16 | Discharge: 2018-06-16 | Disposition: A | Discharge status: Home / Self Care | Payer: Medicare Other | Source: Ambulatory Visit | Attending: Internal Medicine | Admitting: Internal Medicine

## 2018-06-16 ENCOUNTER — Other Ambulatory Visit: Payer: Self-pay

## 2018-06-16 DIAGNOSIS — Z1231 Encounter for screening mammogram for malignant neoplasm of breast: Secondary | ICD-10-CM | POA: Insufficient documentation

## 2019-05-11 ENCOUNTER — Other Ambulatory Visit: Payer: Self-pay | Admitting: Internal Medicine

## 2019-05-11 DIAGNOSIS — Z1231 Encounter for screening mammogram for malignant neoplasm of breast: Secondary | ICD-10-CM

## 2019-06-17 ENCOUNTER — Ambulatory Visit
Admission: RE | Admit: 2019-06-17 | Discharge: 2019-06-17 | Disposition: A | Discharge status: Home / Self Care | Payer: Medicare Other | Source: Ambulatory Visit | Attending: Internal Medicine | Admitting: Internal Medicine

## 2019-06-17 DIAGNOSIS — Z1231 Encounter for screening mammogram for malignant neoplasm of breast: Secondary | ICD-10-CM | POA: Diagnosis not present

## 2020-05-15 ENCOUNTER — Other Ambulatory Visit: Payer: Self-pay | Admitting: Internal Medicine

## 2020-05-15 DIAGNOSIS — Z1231 Encounter for screening mammogram for malignant neoplasm of breast: Secondary | ICD-10-CM

## 2020-06-19 ENCOUNTER — Ambulatory Visit
Admission: RE | Admit: 2020-06-19 | Discharge: 2020-06-19 | Disposition: A | Discharge status: Home / Self Care | Payer: Medicare Other | Source: Ambulatory Visit | Attending: Internal Medicine | Admitting: Internal Medicine

## 2020-06-19 ENCOUNTER — Other Ambulatory Visit: Payer: Self-pay

## 2020-06-19 DIAGNOSIS — Z1231 Encounter for screening mammogram for malignant neoplasm of breast: Secondary | ICD-10-CM | POA: Diagnosis present

## 2021-05-13 ENCOUNTER — Other Ambulatory Visit: Payer: Self-pay | Admitting: Internal Medicine

## 2021-05-13 DIAGNOSIS — Z1231 Encounter for screening mammogram for malignant neoplasm of breast: Secondary | ICD-10-CM

## 2021-06-20 ENCOUNTER — Ambulatory Visit
Admission: RE | Admit: 2021-06-20 | Discharge: 2021-06-20 | Disposition: A | Discharge status: Home / Self Care | Payer: Medicare Other | Source: Ambulatory Visit | Attending: Internal Medicine | Admitting: Internal Medicine

## 2021-06-20 DIAGNOSIS — Z1231 Encounter for screening mammogram for malignant neoplasm of breast: Secondary | ICD-10-CM | POA: Diagnosis present

## 2021-06-26 ENCOUNTER — Encounter: Payer: Self-pay | Admitting: Urology

## 2021-06-26 ENCOUNTER — Ambulatory Visit (INDEPENDENT_AMBULATORY_CARE_PROVIDER_SITE_OTHER): Payer: Medicare Other | Admitting: Urology

## 2021-06-26 VITALS — BP 147/80 | HR 77 | Ht 62.0 in | Wt 131.0 lb

## 2021-06-26 DIAGNOSIS — N390 Urinary tract infection, site not specified: Secondary | ICD-10-CM

## 2021-06-26 LAB — MICROSCOPIC EXAMINATION: WBC, UA: >30 /hpf — AB (ref 0–5)

## 2021-06-26 LAB — URINALYSIS, COMPLETE
Bilirubin, UA: NEGATIVE
Glucose, UA: NEGATIVE
Ketones, UA: NEGATIVE
Nitrite, UA: NEGATIVE
Specific Gravity, UA: 1.02 (ref 1.005–1.030)
Urobilinogen, Ur: 0.2 mg/dL (ref 0.2–1.0)
pH, UA: 7 (ref 5.0–7.5)

## 2021-06-26 LAB — BLADDER SCAN AMB NON-IMAGING: Scan Result: 0

## 2021-06-26 MED ORDER — CEFUROXIME AXETIL 250 MG PO TABS
250.0000 mg | ORAL_TABLET | Freq: Two times a day (BID) | ORAL | 0 refills | Status: AC
Start: 2021-06-26 — End: 2021-07-03

## 2021-06-26 NOTE — Progress Notes (Addendum)
06/26/2021 9:57 AM   Vickie Chaney 05-Jul-1934 [id number]  Referring provider: Baxter Hire, MD [place],  [place] [address]  Chief Complaint  Patient presents with   Recurrent UTI    HPI: Vickie Chaney is a 87 y.o. female referred for recurrent UTI.  Since 2023 has had positive urine cultures for E. coli monthly She is symptomatic with frequency, urgency and dysuria No fever, chills or gross hematuria Symptoms resolved with antibiotic therapy then return Recently prescribed a 5-day course of cephalexin 500 mg twice daily and still having symptoms Taking cranberry supplement for several months   PMH: Past Medical History:  Diagnosis Date   Cancer ([facility])    Chicken pox    GERD (gastroesophageal reflux disease)    Skin cancer     Surgical History: Past Surgical History:  Procedure Laterality Date   bone spur     lt foot   BREAST BIOPSY Left 1998   needle   CARDIAC CATHETERIZATION Left 05/30/2015   Procedure: Left Heart Cath and Coronary Angiography;  Surgeon: Teodoro Spray, MD;  Location: [facility];  Service: Cardiovascular;  Laterality: Left;   cataract surgery      Home Medications:  Allergies as of 06/26/2021       Reactions   Sulfamethoxazole-trimethoprim Nausea Only        Medication List        Accurate as of Jun 26, 2021  9:57 AM. If you have any questions, ask your nurse or doctor.          STOP taking these medications    albuterol 108 (90 Base) MCG/ACT inhaler Commonly known as: VENTOLIN HFA Stopped by: Abbie Sons, MD   budesonide 180 MCG/ACT inhaler Commonly known as: PULMICORT Stopped by: Abbie Sons, MD   fluticasone 50 MCG/ACT nasal spray Commonly known as: FLONASE Stopped by: Abbie Sons, MD   omeprazole 20 MG capsule Commonly known as: PRILOSEC Stopped by: Abbie Sons, MD       TAKE these medications    aspirin 81 MG tablet Take 81 mg by mouth daily.    hydrochlorothiazide 12.5 MG tablet Commonly known as: HYDRODIURIL Take 12.5 mg by mouth daily.   loratadine 10 MG tablet Commonly known as: CLARITIN Take 10 mg by mouth daily.   multivitamin capsule Take 1 capsule by mouth daily.   simvastatin 10 MG tablet Commonly known as: ZOCOR Take 10 mg by mouth daily.   Vitamin B 12 250 MCG Lozg Take by mouth.        Allergies:  Allergies  Allergen Reactions   Sulfamethoxazole-Trimethoprim Nausea Only    Family History: Family History  Problem Relation Age of Onset   Breast cancer Neg Hx     Social History:  reports that she has never smoked. She does not have any smokeless tobacco history on file. She reports that she does not drink alcohol and does not use drugs.   Physical Exam: BP (!) 147/80   Pulse 77   Ht '5\' 2"'$  (1.575 m)   Wt 131 lb (59.4 kg)   BMI 23.96 kg/m   Constitutional:  Alert and oriented, No acute distress. HEENT: [place] AT, moist mucus membranes.   Cardiovascular: No clubbing, cyanosis, or edema. Respiratory: Normal respiratory effort, no increased work of breathing. Psychiatric: Normal mood and affect.  Laboratory Data:  Urinalysis Dipstick 1+ blood/2+ leukocytes Microscopy >30 WBC/3-10 RBC/moderate bacteria   Assessment & Plan:  1. Recurrent UTI Symptomatic infections Bladder scan PVR 0 mL Possible etiologies of recurrent infection include periurethral tissue atrophy in postmenopausal woman, constipation, sexual activity, incomplete emptying, anatomic abnormalities, and even genetic predisposition.  Finally, we discussed the role of perineal hygiene, timed voiding, adequate hydration, topical vaginal estrogen, cranberry prophylaxis, and low-dose antibiotic prophylaxis. Urine culture ordered today Rx cefuroxime 250 mg twice daily x7 days Start low-dose antibiotic prophylaxis with cephalexin 5 mg daily x3 months after above antibiotic course completed Low-dose vaginal estrogen cream twice  weekly Schedule RUS  60-monthfollow-up office visit and instructed to call earlier for recurrent UTI symptoms    SAbbie Sons MD  [place] [place] [phone]

## 2021-06-29 LAB — CULTURE, URINE COMPREHENSIVE

## 2021-06-30 ENCOUNTER — Encounter: Payer: Self-pay | Admitting: Urology

## 2021-07-01 ENCOUNTER — Telehealth: Payer: Self-pay | Admitting: *Deleted

## 2021-07-01 NOTE — Telephone Encounter (Signed)
Notified patient as instructed, patient pleased. Discussed follow-up appointments, patient agrees  

## 2021-07-01 NOTE — Telephone Encounter (Signed)
-----   Message from Abbie Sons, MD sent at 06/30/2021 12:33 PM EDT ----- Regarding: [place] Please find out patient's mail order pharmacy.  Thanks

## 2021-07-01 NOTE — Telephone Encounter (Signed)
-----   Message from Abbie Sons, MD sent at 06/30/2021  9:12 PM EDT ----- UCx was positive and sens to rx'ed antibiotic

## 2021-07-01 NOTE — Telephone Encounter (Signed)
[facility]

## 2021-07-04 ENCOUNTER — Other Ambulatory Visit: Payer: Self-pay | Admitting: Urology

## 2021-07-04 MED ORDER — TRIMETHOPRIM 100 MG PO TABS: 100.0000 mg | ORAL_TABLET | Freq: Every day | ORAL | 0 refills | Status: DC

## 2021-07-04 MED ORDER — ESTRADIOL 0.1 MG/GM VA CREA: TOPICAL_CREAM | VAGINAL | 2 refills | Status: DC

## 2021-07-04 NOTE — Addendum Note (Signed)
Addended by: Abbie Sons on: 07/04/2021 07:11 AM   Modules accepted: Orders

## 2021-07-16 ENCOUNTER — Ambulatory Visit
Admission: RE | Admit: 2021-07-16 | Discharge: 2021-07-16 | Disposition: A | Discharge status: Home / Self Care | Payer: Medicare Other | Source: Ambulatory Visit | Attending: Urology | Admitting: Urology

## 2021-07-16 DIAGNOSIS — N390 Urinary tract infection, site not specified: Secondary | ICD-10-CM | POA: Diagnosis present

## 2021-07-18 ENCOUNTER — Telehealth: Payer: Self-pay | Admitting: Urology

## 2021-07-18 DIAGNOSIS — N1330 Unspecified hydronephrosis: Secondary | ICD-10-CM

## 2021-07-18 NOTE — Telephone Encounter (Signed)
Notified patient as instructed, patient pleased. Discussed follow-up appointments, patient agrees  

## 2021-08-02 ENCOUNTER — Ambulatory Visit
Admission: RE | Admit: 2021-08-02 | Discharge: 2021-08-02 | Disposition: A | Discharge status: Home / Self Care | Payer: Medicare Other | Source: Ambulatory Visit | Attending: Urology | Admitting: Urology

## 2021-08-02 DIAGNOSIS — N1330 Unspecified hydronephrosis: Secondary | ICD-10-CM | POA: Insufficient documentation

## 2021-08-02 LAB — POCT I-STAT CREATININE: Creatinine, Ser: 1.1 mg/dL — ABNORMAL HIGH (ref 0.44–1.00)

## 2021-08-02 MED ORDER — IOHEXOL 300 MG/ML  SOLN
100.0000 mL | Freq: Once | INTRAMUSCULAR | Status: AC | PRN
  Administered 2021-08-02: 100 mL via INTRAVENOUS

## 2021-08-06 ENCOUNTER — Telehealth: Payer: Self-pay | Admitting: *Deleted

## 2021-08-06 NOTE — Telephone Encounter (Signed)
Notified patient as instructed, patient pleased. Discussed follow-up appointments, patient agrees  

## 2021-08-06 NOTE — Telephone Encounter (Signed)
-----   Message from Abbie Sons, MD sent at 08/06/2021  3:21 PM EDT ----- CT showed no evidence of kidney blockage.  No findings that would suggest a cause of her lower abdominal pain.  Recommend continuing low-dose antibiotic and vaginal estrogen.  Keep follow-up as scheduled

## 2021-09-26 ENCOUNTER — Ambulatory Visit (INDEPENDENT_AMBULATORY_CARE_PROVIDER_SITE_OTHER): Payer: Medicare Other | Admitting: Urology

## 2021-09-26 ENCOUNTER — Encounter: Payer: Self-pay | Admitting: Urology

## 2021-09-26 VITALS — BP 129/68 | HR 79 | Ht 62.0 in | Wt 129.0 lb

## 2021-09-26 DIAGNOSIS — N390 Urinary tract infection, site not specified: Secondary | ICD-10-CM

## 2021-09-26 DIAGNOSIS — Z87440 Personal history of urinary (tract) infections: Secondary | ICD-10-CM | POA: Diagnosis not present

## 2021-09-26 LAB — MICROSCOPIC EXAMINATION: Epithelial Cells (non renal): >10 /hpf — AB (ref 0–10)

## 2021-09-26 LAB — URINALYSIS, COMPLETE
Bilirubin, UA: NEGATIVE
Glucose, UA: NEGATIVE
Ketones, UA: NEGATIVE
Nitrite, UA: NEGATIVE
Protein,UA: NEGATIVE
RBC, UA: NEGATIVE
Specific Gravity, UA: 1.015 (ref 1.005–1.030)
Urobilinogen, Ur: 0.2 mg/dL (ref 0.2–1.0)
pH, UA: 7 (ref 5.0–7.5)

## 2021-09-27 ENCOUNTER — Encounter: Payer: Self-pay | Admitting: Urology

## 2021-09-27 NOTE — Progress Notes (Signed)
09/26/2021 6:58 AM   Vickie Chaney 20-Nov-1934 [id number]  Referring provider: Baxter Hire, MD [place],  [place] [address]  Chief Complaint  Patient presents with   Recurrent UTI    Urologic history: 1.  Recurrent UTI Estrace vaginal cream twice weekly Cranberry supplements  HPI: 87 y.o. female presents for 3 month follow-up.  Initially seen 06/26/2021 3 months trial low-dose antibiotic prophylaxis (trimethoprim) was started in addition to Estrace vaginal cream twice weekly She has been asymptomatic and denies recurrent UTIs with the exception of one episode which occurred shortly after her initial visit No complaints today RUS was felt to show mild hydronephrosis.  Follow-up CTU ordered which showed prominent extrarenal pelves but no hydronephrosis.  No abnormalities noted.   PMH: Past Medical History:  Diagnosis Date   Cancer ([facility])    Chicken pox    GERD (gastroesophageal reflux disease)    Skin cancer     Surgical History: Past Surgical History:  Procedure Laterality Date   bone spur     lt foot   BREAST BIOPSY Left 1998   needle   CARDIAC CATHETERIZATION Left 05/30/2015   Procedure: Left Heart Cath and Coronary Angiography;  Surgeon: Teodoro Spray, MD;  Location: [facility];  Service: Cardiovascular;  Laterality: Left;   cataract surgery      Home Medications:  Allergies as of 09/26/2021       Reactions   Sulfamethoxazole-trimethoprim Nausea Only        Medication List        Accurate as of September 26, 2021 11:59 PM. If you have any questions, ask your nurse or doctor.          aspirin 81 MG tablet Take 81 mg by mouth daily.   Cranberry 1000 MG Caps Take by mouth.   estradiol 0.1 MG/GM vaginal cream Commonly known as: ESTRACE Apply a pea-sized amount to fingertip and wipe in vaginal introitus twice weekly   hydrochlorothiazide 12.5 MG tablet Commonly known as: HYDRODIURIL Take 12.5 mg by mouth  daily.   loratadine 10 MG tablet Commonly known as: CLARITIN Take 10 mg by mouth daily.   multivitamin capsule Take 1 capsule by mouth daily.   simvastatin 10 MG tablet Commonly known as: ZOCOR Take 10 mg by mouth daily.   trimethoprim 100 MG tablet Commonly known as: TRIMPEX TAKE 1 TABLET DAILY.   Vitamin B 12 500 MCG Tabs Take by mouth.        Allergies:  Allergies  Allergen Reactions   Sulfamethoxazole-Trimethoprim Nausea Only    Family History: Family History  Problem Relation Age of Onset   Breast cancer Neg Hx     Social History:  reports that she has never smoked. She does not have any smokeless tobacco history on file. She reports that she does not drink alcohol and does not use drugs.   Physical Exam: BP 129/68   Pulse 79   Ht '5\' 2"'$  (1.575 m)   Wt 129 lb (58.5 kg)   BMI 23.59 kg/m   Constitutional:  Alert and oriented, No acute distress. HEENT: [place] AT Respiratory: Normal respiratory effort, no increased work of breathing. Psychiatric: Normal mood and affect.  Laboratory Data:  Urinalysis Microscopy 11-30 WBC however >10 epis consistent with vaginal contamination  Pertinent Imaging:  Ultrasound renal complete  Narrative CLINICAL DATA:  Recurrent UTI  EXAM: RENAL / URINARY TRACT ULTRASOUND COMPLETE  COMPARISON:  None Available.  FINDINGS: Right Kidney:  Renal measurements: 10.5 x 4.0 x 4.1 cm = volume: 8.9 mL. There appears to be a fluid-filled extrarenal pelvis. No caliectasis.  Left Kidney:  Renal measurements: 10.0 x 4.8 x 4.5 cm = volume: 114 mL. Mild hydronephrosis.  Bladder:  Appears normal for degree of bladder distention.  Other:  None.  IMPRESSION: 1. Fluid-filled mildly prominent right extrarenal pelvis without caliectasis. 2. Mild hydronephrosis on the left, age indeterminate. 3. No other abnormalities.   Electronically Signed By: Dorise Bullion III M.D. On: 07/16/2021 18:28  No results found for this  or any previous visit.  Results for orders placed during the hospital encounter of 08/02/21  CT HEMATURIA WORKUP  Narrative CLINICAL DATA:  Recurrent UTIs. Right-sided hydronephrosis on recent renal ultrasound examination.  EXAM: CT ABDOMEN AND PELVIS WITHOUT AND WITH CONTRAST  TECHNIQUE: Multidetector CT imaging of the abdomen and pelvis was performed following the standard protocol before and following the bolus administration of intravenous contrast.  RADIATION DOSE REDUCTION: This exam was performed according to the departmental dose-optimization program which includes automated exposure control, adjustment of the mA and/or kV according to patient size and/or use of iterative reconstruction technique.  CONTRAST:  170m OMNIPAQUE IOHEXOL 300 MG/ML  SOLN  COMPARISON:  08/04/2012 CT scan and 07/16/2021 renal ultrasound.  FINDINGS: Lower chest: Nodular scarring changes at the right lung base are stable. No infiltrates or effusions. The heart is normal in size. No pericardial effusion.  Hepatobiliary: No hepatic lesions or intrahepatic biliary dilatation. The gallbladder is unremarkable. No common bile duct dilatation.  Pancreas: No mass, inflammation or ductal dilatation.  Spleen: Normal size. No focal lesions.  Adrenals/Urinary Tract: Adrenal glands are normal.  No renal, ureteral or bladder calculi. Both kidneys demonstrate normal enhancement/perfusion. No worrisome renal lesions.  The delayed images do not demonstrate any significant collecting system abnormalities. Slightly prominent extra renal pelves bilaterally but no hydronephrosis. Both ureters are normal. No bladder lesions.  Stomach/Bowel: The stomach, duodenum, small bowel and colon grossly normal. No acute inflammatory process, mass lesions or obstructive findings. There is significant and diffuse colonic diverticulosis but no findings for acute diverticulitis.  Vascular/Lymphatic: Moderate to  advanced atherosclerotic calcification involving the aorta and branch vessels but no aneurysm or dissection. The major venous structures are patent. No mesenteric or retroperitoneal mass or adenopathy.  Reproductive: The uterus is surgically absent. Both ovaries are still present appear normal.  Other: No pelvic mass or adenopathy. No free pelvic fluid collections. No inguinal mass or adenopathy. No abdominal wall hernia or subcutaneous lesions.  Musculoskeletal: No significant bony findings.  IMPRESSION: 1. Mildly prominent bilateral extrarenal pelves but no hydronephrosis. No renal, ureteral or bladder calculi or mass. 2. No acute abdominal/pelvic findings, mass lesions or adenopathy. 3. Diffuse colonic diverticulosis but no findings for acute diverticulitis. 4. Moderate to advanced atherosclerotic calcification involving the aorta and branch vessels.  Aortic Atherosclerosis (ICD10-I70.0).   Electronically Signed By: PMarijo SanesM.D. On: 08/04/2021 14:51  No results found for this or any previous visit.   Assessment & Plan:    1. Recurrent UTI Doing well Continue Estrace vaginal cream twice weekly Since she has been on vaginal estrogen 3 months we will discontinue low-dose antibiotic prophylaxis Instructed to call for recurrent UTI symptoms Follow-up 6 months with urinalysis   SAbbie Sons MD  [address] [place] Holiday[place] [phone]

## 2022-03-27 ENCOUNTER — Ambulatory Visit (INDEPENDENT_AMBULATORY_CARE_PROVIDER_SITE_OTHER): Payer: Medicare Other | Admitting: Urology

## 2022-03-27 ENCOUNTER — Encounter: Payer: Self-pay | Admitting: Urology

## 2022-03-27 VITALS — BP 151/87 | HR 70 | Ht 62.0 in | Wt 133.0 lb

## 2022-03-27 DIAGNOSIS — Z87440 Personal history of urinary (tract) infections: Secondary | ICD-10-CM | POA: Diagnosis not present

## 2022-03-27 DIAGNOSIS — N390 Urinary tract infection, site not specified: Secondary | ICD-10-CM

## 2022-03-27 LAB — URINALYSIS, COMPLETE
Bilirubin, UA: NEGATIVE
Glucose, UA: NEGATIVE
Ketones, UA: NEGATIVE
Leukocytes,UA: NEGATIVE
Nitrite, UA: NEGATIVE
Protein,UA: NEGATIVE
RBC, UA: NEGATIVE
Specific Gravity, UA: 1.02 (ref 1.005–1.030)
Urobilinogen, Ur: 0.2 mg/dL (ref 0.2–1.0)
pH, UA: 7 (ref 5.0–7.5)

## 2022-03-27 LAB — MICROSCOPIC EXAMINATION: Epithelial Cells (non renal): >10 /hpf — AB (ref 0–10)

## 2022-03-27 NOTE — Progress Notes (Signed)
03/27/2022 11:29 AM   Vickie Chaney 02/12/1934 [id number]  Referring provider: Baxter Hire, MD [place],  [place] [address]  Chief Complaint  Patient presents with   Recurrent UTI    Urologic history: 1.  Recurrent UTI Estrace vaginal cream twice weekly Cranberry supplements  HPI: 88 y.o. female presents for 6 month follow-up.  Initially seen 06/26/2021 Continues on low-dose vaginal estrogen and has remained infection free.  No complaints today   PMH: Past Medical History:  Diagnosis Date   Cancer ([facility])    Chicken pox    GERD (gastroesophageal reflux disease)    Skin cancer     Surgical History: Past Surgical History:  Procedure Laterality Date   bone spur     lt foot   BREAST BIOPSY Left 1998   needle   CARDIAC CATHETERIZATION Left 05/30/2015   Procedure: Left Heart Cath and Coronary Angiography;  Surgeon: Teodoro Spray, MD;  Location: [facility];  Service: Cardiovascular;  Laterality: Left;   cataract surgery      Home Medications:  Allergies as of 03/27/2022       Reactions   Sulfamethoxazole-trimethoprim Nausea Only        Medication List        Accurate as of March 27, 2022 11:29 AM. If you have any questions, ask your nurse or doctor.          STOP taking these medications    Cranberry 1000 MG Caps Stopped by: Abbie Sons, MD   trimethoprim 100 MG tablet Commonly known as: TRIMPEX Stopped by: Abbie Sons, MD       TAKE these medications    aspirin 81 MG tablet Take 81 mg by mouth daily.   estradiol 0.1 MG/GM vaginal cream Commonly known as: ESTRACE Apply a pea-sized amount to fingertip and wipe in vaginal introitus twice weekly   Fluocinolone Acetonide 0.01 % Oil SMARTSIG:In Ear(s) Daily PRN   hydrochlorothiazide 12.5 MG tablet Commonly known as: HYDRODIURIL Take 12.5 mg by mouth daily.   loratadine 10 MG tablet Commonly known as: CLARITIN Take 10 mg by mouth daily.    multivitamin capsule Take 1 capsule by mouth daily.   simvastatin 10 MG tablet Commonly known as: ZOCOR Take 10 mg by mouth daily.   tacrolimus 0.1 % ointment Commonly known as: PROTOPIC Apply topically daily.   triamcinolone cream 0.1 % Commonly known as: KENALOG SMARTSIG:Topical 1-2 Times Daily PRN   Vitamin B 12 500 MCG Tabs Take by mouth.        Allergies:  Allergies  Allergen Reactions   Sulfamethoxazole-Trimethoprim Nausea Only    Family History: Family History  Problem Relation Age of Onset   Breast cancer Neg Hx     Social History:  reports that she has never smoked. She does not have any smokeless tobacco history on file. She reports that she does not drink alcohol and does not use drugs.   Physical Exam: BP (!) 151/87 (BP Location: Left Arm, Patient Position: Sitting, Cuff Size: Normal)   Pulse 70   Ht '5\' 2"'$  (1.575 m)   Wt 133 lb (60.3 kg)   BMI 24.33 kg/m   Constitutional:  Alert and oriented, No acute distress. HEENT:  AT Respiratory: Normal respiratory effort, no increased work of breathing. Psychiatric: Normal mood and affect.  Laboratory Data:  Urinalysis Dipstick/microscopy negative   Assessment & Plan:    1. Recurrent UTI Doing well on low-dose vaginal estrogen Follow-up  1 year instructed call earlier for recurrent UTI symptoms   Abbie Sons, MD  [facility] [address] [place], [place] [address] [phone]

## 2022-06-09 ENCOUNTER — Other Ambulatory Visit: Payer: Self-pay

## 2022-06-09 DIAGNOSIS — Z1231 Encounter for screening mammogram for malignant neoplasm of breast: Secondary | ICD-10-CM

## 2022-06-25 ENCOUNTER — Ambulatory Visit
Admission: RE | Admit: 2022-06-25 | Discharge: 2022-06-25 | Disposition: A | Discharge status: Home / Self Care | Payer: Medicare Other | Source: Ambulatory Visit | Attending: Internal Medicine | Admitting: Internal Medicine

## 2022-06-25 DIAGNOSIS — Z1231 Encounter for screening mammogram for malignant neoplasm of breast: Secondary | ICD-10-CM | POA: Diagnosis present

## 2022-09-29 ENCOUNTER — Other Ambulatory Visit: Payer: Self-pay | Admitting: Urology

## 2022-09-30 ENCOUNTER — Other Ambulatory Visit: Payer: Self-pay | Admitting: Urology

## 2023-02-27 ENCOUNTER — Telehealth: Payer: Self-pay

## 2023-02-27 ENCOUNTER — Ambulatory Visit (INDEPENDENT_AMBULATORY_CARE_PROVIDER_SITE_OTHER): Payer: Medicare Other | Admitting: Physician Assistant

## 2023-02-27 ENCOUNTER — Encounter: Payer: Self-pay | Admitting: Physician Assistant

## 2023-02-27 VITALS — BP 159/81 | HR 83 | Ht 62.0 in | Wt 131.0 lb

## 2023-02-27 DIAGNOSIS — N390 Urinary tract infection, site not specified: Secondary | ICD-10-CM

## 2023-02-27 LAB — URINALYSIS, COMPLETE
Bilirubin, UA: NEGATIVE
Glucose, UA: NEGATIVE
Ketones, UA: NEGATIVE
Nitrite, UA: NEGATIVE
Specific Gravity, UA: 1.02 (ref 1.005–1.030)
Urobilinogen, Ur: 0.2 mg/dL (ref 0.2–1.0)
pH, UA: 6 (ref 5.0–7.5)

## 2023-02-27 LAB — MICROSCOPIC EXAMINATION: WBC, UA: >30 /[HPF] — AB (ref 0–5)

## 2023-02-27 LAB — BLADDER SCAN AMB NON-IMAGING

## 2023-02-27 MED ORDER — CEFUROXIME AXETIL 250 MG PO TABS
250.0000 mg | ORAL_TABLET | Freq: Two times a day (BID) | ORAL | 0 refills | Status: AC
Start: 2023-02-27 — End: 2023-03-09

## 2023-02-27 NOTE — Progress Notes (Signed)
02/27/2023 11:51 AM   [name] 18-Jan-1935 [id number]  CC: Chief Complaint  Patient presents with   Follow-up   Urinary Frequency   HPI: Vickie Chaney is a 89 y.o. female with PMH recurrent UTI on cranberry supplements and topical vaginal estrogen cream twice weekly who presents today for evaluation of possible UTI.   She was treated for acute cystitis by her PCP last month with Keflex 500 mg twice daily x 7 days, which was then transition to Macrobid 100 mg twice daily x 7 days based on culture results.  Culture grew MDR E. coli.  Today she reports her symptoms resolved for about 2 weeks after completing culture appropriate Macrobid.  4 days ago, her dysuria, urgency, and frequency returned.  She took 2 Cystex earlier today.  Notably, she feels that overall her bladder symptoms have improved since starting topical vaginal estrogen cream.  She states that her UTI symptoms are less intense than they were previously.  In-office UA today positive for 3+ blood, 2+ protein, and 2+ leukocytes; urine microscopy with >30 WBCs/HPF, 3-10 RBCs/HPF, and moderate bacteria. PVR 85mL.  PMH: Past Medical History:  Diagnosis Date   Cancer ([facility])    Chicken pox    GERD (gastroesophageal reflux disease)    Skin cancer     Surgical History: Past Surgical History:  Procedure Laterality Date   bone spur     lt foot   BREAST BIOPSY Left 1998   needle   CARDIAC CATHETERIZATION Left 05/30/2015   Procedure: Left Heart Cath and Coronary Angiography;  Surgeon: Dalia Heading, MD;  Location: [facility];  Service: Cardiovascular;  Laterality: Left;   cataract surgery      Home Medications:  Allergies as of 02/27/2023       Reactions   Sulfamethoxazole-trimethoprim Nausea Only        Medication List        Accurate as of February 27, 2023 11:51 AM. If you have any questions, ask your nurse or doctor.          STOP taking these medications    Fluocinolone Acetonide  0.01 % Oil Stopped by: Carman Ching   loratadine 10 MG tablet Commonly known as: CLARITIN Stopped by: Carman Ching   tacrolimus 0.1 % ointment Commonly known as: PROTOPIC Stopped by: Carman Ching   triamcinolone cream 0.1 % Commonly known as: KENALOG Stopped by: Carman Ching       TAKE these medications    aspirin 81 MG tablet Take 81 mg by mouth daily.   cefUROXime 250 MG tablet Commonly known as: CEFTIN Take 1 tablet (250 mg total) by mouth 2 (two) times daily with a meal for 10 days. Started by: Carman Ching   estradiol 0.1 MG/GM vaginal cream Commonly known as: ESTRACE VAGINAL APPLY A PEA-SIZED AMOUNT TOFINGERTIP AND WIPE IN      VAGINAL INTROITUS 2 TIMES AWEEK   glipiZIDE 2.5 MG 24 hr tablet Commonly known as: GLUCOTROL XL Take 2.5 mg by mouth daily.   hydrochlorothiazide 12.5 MG tablet Commonly known as: HYDRODIURIL Take 12.5 mg by mouth daily.   multivitamin capsule Take 1 capsule by mouth daily.   simvastatin 10 MG tablet Commonly known as: ZOCOR Take 10 mg by mouth daily.   Vitamin B 12 500 MCG Tabs Take by mouth.        Allergies:  Allergies  Allergen Reactions   Sulfamethoxazole-Trimethoprim Nausea Only    Family History: Family History  Problem  Relation Age of Onset   Breast cancer Neg Hx     Social History:   reports that she has never smoked. She does not have any smokeless tobacco history on file. She reports that she does not drink alcohol and does not use drugs.  Physical Exam: BP (!) 159/81   Pulse 83   Ht 5\' 2"  (1.575 m)   Wt 131 lb (59.4 kg)   BMI 23.96 kg/m   Constitutional:  Alert and oriented, no acute distress, nontoxic appearing HEENT: [place], AT Cardiovascular: No clubbing, cyanosis, or edema Respiratory: Normal respiratory effort, no increased work of breathing Skin: No rashes, bruises or suspicious lesions Neurologic: Grossly intact, no focal deficits, moving all 4  extremities Psychiatric: Normal mood and affect  Laboratory Data: Results for orders placed or performed in visit on 02/27/23  BLADDER SCAN AMB NON-IMAGING   Collection Time: 02/27/23 11:08 AM  Result Value Ref Range   Scan Result 85ml   Microscopic Examination   Collection Time: 02/27/23 11:12 AM   Urine  Result Value Ref Range   WBC, UA >30 (A) 0 - 5 /hpf   RBC, Urine 3-10 (A) 0 - 2 /hpf   Epithelial Cells (non renal) 0-10 0 - 10 /hpf   Mucus, UA Present (A) Not Estab.   Bacteria, UA Moderate (A) None seen/Few  Urinalysis, Complete   Collection Time: 02/27/23 11:12 AM  Result Value Ref Range   Specific Gravity, UA 1.020 1.005 - 1.030   pH, UA 6.0 5.0 - 7.5   Color, UA Yellow Yellow   Appearance Ur Cloudy (A) Clear   Leukocytes,UA 2+ (A) Negative   Protein,UA 2+ (A) Negative/Trace   Glucose, UA Negative Negative   Ketones, UA Negative Negative   RBC, UA 3+ (A) Negative   Bilirubin, UA Negative Negative   Urobilinogen, Ur 0.2 0.2 - 1.0 mg/dL   Nitrite, UA Negative Negative   Microscopic Examination See below:    Assessment & Plan:   1. Recurrent UTI (Primary) UA appears grossly infected.  Will start empiric cefuroxime and send for culture for further evaluation.  Will treat with a longer course of antibiotics due to symptom recurrence within 2 weeks this time.  Agree with continued topical vaginal estrogen cream and cranberry supplements. - Urinalysis, Complete - BLADDER SCAN AMB NON-IMAGING - CULTURE, URINE COMPREHENSIVE - cefUROXime (CEFTIN) 250 MG tablet; Take 1 tablet (250 mg total) by mouth 2 (two) times daily with a meal for 10 days.  Dispense: 20 tablet; Refill: 0  Return for Keep follow-up as scheduled.  Carman Ching, PA-C  [facility] [place] [address] [place], [place] [address] [phone]

## 2023-02-27 NOTE — Telephone Encounter (Signed)
Incoming call from pt on triage line who states she has a hx of rUTI, beginning Monday of this week she has had and increase in urinary frequency, urgency, and dysuria. She was treated by her PCP for a uti in late December/early January and has completed that antibiotic. Pt scheduled for office visit today.

## 2023-03-03 LAB — CULTURE, URINE COMPREHENSIVE

## 2023-03-27 ENCOUNTER — Ambulatory Visit: Payer: Medicare Other | Admitting: Urology

## 2023-03-27 ENCOUNTER — Encounter: Payer: Self-pay | Admitting: Urology

## 2023-03-27 VITALS — BP 158/75 | HR 68 | Ht 62.0 in | Wt 131.0 lb

## 2023-03-27 DIAGNOSIS — N390 Urinary tract infection, site not specified: Secondary | ICD-10-CM

## 2023-03-27 LAB — URINALYSIS, COMPLETE
Bilirubin, UA: NEGATIVE
Glucose, UA: NEGATIVE
Ketones, UA: NEGATIVE
Nitrite, UA: NEGATIVE
Protein,UA: NEGATIVE
RBC, UA: NEGATIVE
Specific Gravity, UA: 1.025 (ref 1.005–1.030)
Urobilinogen, Ur: 0.2 mg/dL (ref 0.2–1.0)
pH, UA: 6.5 (ref 5.0–7.5)

## 2023-03-27 LAB — MICROSCOPIC EXAMINATION: Epithelial Cells (non renal): >10 /HPF — AB (ref 0–10)

## 2023-03-27 NOTE — Progress Notes (Signed)
 I, [name], acting as a scribe for [name], MD., have documented all relevant documentation on the behalf of [name], MD, as directed by [name], MD while in the presence of [name], MD.  03/27/2023 11:43 AM   Vickie Chaney 29-May-1934 [id number]  Referring provider: Gracelyn Nurse, MD [address] [facility] [place],  [place] [address]  Chief Complaint  Patient presents with   Recurrent UTI   Urologic history: 1.  Recurrent UTI Estrace vaginal cream twice weekly Cranberry supplements  HPI: Vickie Chaney is a 89 y.o. female presents for a follow-up visit.  She did see Hilton Sinclair 02/27/2023 for recurrent infections. She had had 2 treatments by her PCP with Keflex and Macrobid. Her symptoms resolved after 2 weeks, however sxs returned with both antibiotics. Urinalysis at that visit showed significant pyuria. Her urine culture was negative but she had taken Cystex prior to her visit.  She was treated empirically with Cefuroxime with resolution of her symptoms and has no complaints today.  She does feel the Estrace has significantly helped the frequency and intensity of her infections.  She has no complaints today.    PMH: Past Medical History:  Diagnosis Date   Cancer (HCC)    Chicken pox    GERD (gastroesophageal reflux disease)    Skin cancer     Surgical History: Past Surgical History:  Procedure Laterality Date   bone spur     lt foot   BREAST BIOPSY Left 1998   needle   CARDIAC CATHETERIZATION Left 05/30/2015   Procedure: Left Heart Cath and Coronary Angiography;  Surgeon: Dalia Heading, MD;  Location: [facility] INVASIVE CV LAB;  Service: Cardiovascular;  Laterality: Left;   cataract surgery      Home Medications:  Allergies as of 03/27/2023       Reactions   Sulfamethoxazole-trimethoprim Nausea Only        Medication List        Accurate as of March 27, 2023 11:43 AM. If you have any  questions, ask your nurse or doctor.          aspirin 81 MG tablet Take 81 mg by mouth daily.   estradiol 0.1 MG/GM vaginal cream Commonly known as: ESTRACE VAGINAL APPLY A PEA-SIZED AMOUNT TOFINGERTIP AND WIPE IN      VAGINAL INTROITUS 2 TIMES AWEEK   glipiZIDE 2.5 MG 24 hr tablet Commonly known as: GLUCOTROL XL Take 2.5 mg by mouth daily.   hydrochlorothiazide 12.5 MG tablet Commonly known as: HYDRODIURIL Take 12.5 mg by mouth daily.   hydrocortisone 2.5 % ointment Apply topically.   multivitamin capsule Take 1 capsule by mouth daily.   simvastatin 10 MG tablet Commonly known as: ZOCOR Take 10 mg by mouth daily.   tacrolimus 0.1 % ointment Commonly known as: PROTOPIC SMARTSIG:sparingly In Eye(s) Daily PRN   Vitamin B 12 500 MCG Tabs Take by mouth.        Allergies:  Allergies  Allergen Reactions   Sulfamethoxazole-Trimethoprim Nausea Only    Family History: Family History  Problem Relation Age of Onset   Breast cancer Neg Hx     Social History:  reports that she has never smoked. She does not have any smokeless tobacco history on file. She reports that she does not drink alcohol and does not use drugs.   Physical Exam: BP (!) 158/75   Pulse 68   Ht 5\' 2"  (1.575  m)   Wt 131 lb (59.4 kg)   BMI 23.96 kg/m   Constitutional:  Alert and oriented, No acute distress. HEENT: [place] AT Respiratory: Normal respiratory effort, no increased work of breathing. Psychiatric: Normal mood and affect.   Urinalysis Dipstick 1+ leukocytes, microscopy 6-10 WBCs, however there are significant squamous epithelial cells at >10 epi    Assessment & Plan:    1. Recurrent UTI Continue cranberry supplements and vaginal estrogen cream 1 year follow-up and instructed to call earlier for recurrent UTI symptoms.  I have reviewed the above documentation for accuracy and completeness, and I agree with the above.   [name], MD  [facility] [address] [place], [place] [address] [phone]

## 2023-03-28 ENCOUNTER — Encounter: Payer: Self-pay | Admitting: Urology

## 2023-03-31 ENCOUNTER — Telehealth: Payer: Self-pay

## 2023-03-31 NOTE — Telephone Encounter (Signed)
 Pt LM on triage line stating she has seen the results of her UA via mychart and questions if she has an infection.  Called pt back no answer. Left detailed message per DPR informing pt that her urine specimen appeared contaminated due to >10 epi cells and based on her being asymptomatic no need for treatment at this time.

## 2023-05-05 ENCOUNTER — Ambulatory Visit (INDEPENDENT_AMBULATORY_CARE_PROVIDER_SITE_OTHER): Admitting: Physician Assistant

## 2023-05-05 ENCOUNTER — Encounter: Payer: Self-pay | Admitting: Physician Assistant

## 2023-05-05 VITALS — BP 146/75 | HR 71 | Ht 62.0 in | Wt 130.0 lb

## 2023-05-05 DIAGNOSIS — N390 Urinary tract infection, site not specified: Secondary | ICD-10-CM

## 2023-05-05 DIAGNOSIS — R3911 Hesitancy of micturition: Secondary | ICD-10-CM

## 2023-05-05 LAB — URINALYSIS, COMPLETE
Bilirubin, UA: NEGATIVE
Glucose, UA: NEGATIVE
Ketones, UA: NEGATIVE
Nitrite, UA: NEGATIVE
Protein,UA: NEGATIVE
RBC, UA: NEGATIVE
Specific Gravity, UA: 1.015 (ref 1.005–1.030)
Urobilinogen, Ur: 0.2 mg/dL (ref 0.2–1.0)
pH, UA: 7 (ref 5.0–7.5)

## 2023-05-05 LAB — MICROSCOPIC EXAMINATION

## 2023-05-05 MED ORDER — NITROFURANTOIN MONOHYD MACRO 100 MG PO CAPS
100.0000 mg | ORAL_CAPSULE | Freq: Two times a day (BID) | ORAL | 0 refills | Status: AC
Start: 2023-05-05 — End: 2023-05-15

## 2023-05-05 NOTE — Progress Notes (Signed)
 05/05/2023 10:11 AM   [name] 12-27-1934 [id number]  CC: Chief Complaint  Patient presents with   Recurrent UTI   HPI: Vickie Chaney is a 89 y.o. female with PMH rUTI on cranberry and estrogen cream who presents today for evaluation of possible UTI.   Today she reports 1 week of dysuria, frequency, suprapubic pain, and low back pain.  She has been pushing fluids and her symptoms have improved somewhat.  She continues to use estrogen cream twice weekly and thinks this helps with her symptom severity.  She reports some chronic hesitancy and weak stream, but does not wish to try pharmacotherapy for this.  Notably, she had a CT urogram in July 2023 that showed no bladder distention.  In-office UA today positive for 1+ leukocytes; urine microscopy with 11-30 WBCs/HPF and many bacteria.  PMH: Past Medical History:  Diagnosis Date   Cancer (HCC)    Chicken pox    GERD (gastroesophageal reflux disease)    Skin cancer     Surgical History: Past Surgical History:  Procedure Laterality Date   bone spur     lt foot   BREAST BIOPSY Left 1998   needle   CARDIAC CATHETERIZATION Left 05/30/2015   Procedure: Left Heart Cath and Coronary Angiography;  Surgeon: Dalia Heading, MD;  Location: [facility] INVASIVE CV LAB;  Service: Cardiovascular;  Laterality: Left;   cataract surgery      Home Medications:  Allergies as of 05/05/2023       Reactions   Sulfamethoxazole-trimethoprim Nausea Only        Medication List        Accurate as of May 05, 2023 10:11 AM. If you have any questions, ask your nurse or doctor.          aspirin 81 MG tablet Take 81 mg by mouth daily.   estradiol 0.1 MG/GM vaginal cream Commonly known as: ESTRACE VAGINAL APPLY A PEA-SIZED AMOUNT TOFINGERTIP AND WIPE IN      VAGINAL INTROITUS 2 TIMES AWEEK   glipiZIDE 2.5 MG 24 hr tablet Commonly known as: GLUCOTROL XL Take 2.5 mg by mouth daily.   hydrochlorothiazide 12.5 MG tablet Commonly  known as: HYDRODIURIL Take 12.5 mg by mouth daily.   hydrocortisone 2.5 % ointment Apply topically.   multivitamin capsule Take 1 capsule by mouth daily.   simvastatin 10 MG tablet Commonly known as: ZOCOR Take 10 mg by mouth daily.   tacrolimus 0.1 % ointment Commonly known as: PROTOPIC SMARTSIG:sparingly In Eye(s) Daily PRN   Vitamin B 12 500 MCG Tabs Take by mouth.        Allergies:  Allergies  Allergen Reactions   Sulfamethoxazole-Trimethoprim Nausea Only    Family History: Family History  Problem Relation Age of Onset   Breast cancer Neg Hx     Social History:   reports that she has never smoked. She does not have any smokeless tobacco history on file. She reports that she does not drink alcohol and does not use drugs.  Physical Exam: BP (!) 146/75   Pulse 71   Ht 5\' 2"  (1.575 m)   Wt 130 lb (59 kg)   BMI 23.78 kg/m   Constitutional:  Alert and oriented, no acute distress, nontoxic appearing HEENT: [place], AT Cardiovascular: No clubbing, cyanosis, or edema Respiratory: Normal respiratory effort, no increased work of breathing Skin: No rashes, bruises or suspicious lesions Neurologic: Grossly intact, no focal deficits, moving all 4 extremities Psychiatric: Normal mood and affect  Laboratory Data: Results for orders placed or performed in visit on 05/05/23  Microscopic Examination   Collection Time: 05/05/23  9:40 AM   Urine  Result Value Ref Range   WBC, UA 11-30 (A) 0 - 5 /hpf   RBC, Urine 0-2 0 - 2 /hpf   Epithelial Cells (non renal) 0-10 0 - 10 /hpf   Mucus, UA Present (A) Not Estab.   Bacteria, UA Many (A) None seen/Few  Urinalysis, Complete   Collection Time: 05/05/23  9:40 AM  Result Value Ref Range   Specific Gravity, UA 1.015 1.005 - 1.030   pH, UA 7.0 5.0 - 7.5   Color, UA Yellow Yellow   Appearance Ur Hazy (A) Clear   Leukocytes,UA 1+ (A) Negative   Protein,UA Negative Negative/Trace   Glucose, UA Negative Negative   Ketones, UA  Negative Negative   RBC, UA Negative Negative   Bilirubin, UA Negative Negative   Urobilinogen, Ur 0.2 0.2 - 1.0 mg/dL   Nitrite, UA Negative Negative   Microscopic Examination See below:    Assessment & Plan:   1. Recurrent UTI (Primary) UA appears grossly infected today, will start empiric Macrobid and send for culture for further evaluation.  Given her frequency of infections, I offered her suppressive antibiotics and she would like to try these.  We discussed that she should remain on vaginal estrogen cream in addition.  Will plan to start her on 3 months of suppressive antibiotics based on culture results.  She is in agreement with this plan. - Urinalysis, Complete - CULTURE, URINE COMPREHENSIVE - nitrofurantoin, macrocrystal-monohydrate, (MACROBID) 100 MG capsule; Take 1 capsule (100 mg total) by mouth 2 (two) times daily for 10 days.  Dispense: 20 capsule; Refill: 0  2. Urinary hesitancy Chronic hesitancy/weak stream, the no evidence of incomplete bladder emptying on prior imaging.  Will continue to monitor.  May consider Flomax to see if this offers her symptom improvement in the future if becomes more bothersome.  Return for Will call to start suppressive abx per cx results.  Carman Ching, PA-C  [facility] [place] [address], [address] [place], [place] [address] [phone]

## 2023-05-08 LAB — CULTURE, URINE COMPREHENSIVE

## 2023-05-08 MED ORDER — NITROFURANTOIN MONOHYD MACRO 100 MG PO CAPS
100.0000 mg | ORAL_CAPSULE | Freq: Every day | ORAL | 2 refills | Status: DC
Start: 2023-05-08 — End: 2023-08-03

## 2023-05-08 NOTE — Addendum Note (Signed)
 Addended by: Debarah Crape on: 05/08/2023 05:23 PM   Modules accepted: Orders

## 2023-05-12 ENCOUNTER — Other Ambulatory Visit: Payer: Self-pay | Admitting: Internal Medicine

## 2023-05-12 DIAGNOSIS — Z1231 Encounter for screening mammogram for malignant neoplasm of breast: Secondary | ICD-10-CM

## 2023-06-26 ENCOUNTER — Ambulatory Visit
Admission: RE | Admit: 2023-06-26 | Discharge: 2023-06-26 | Disposition: A | Discharge status: Home / Self Care | Source: Ambulatory Visit | Attending: Internal Medicine | Admitting: Internal Medicine

## 2023-06-26 DIAGNOSIS — Z1231 Encounter for screening mammogram for malignant neoplasm of breast: Secondary | ICD-10-CM | POA: Diagnosis present

## 2023-07-30 DIAGNOSIS — N390 Urinary tract infection, site not specified: Secondary | ICD-10-CM

## 2023-08-03 MED ORDER — NITROFURANTOIN MONOHYD MACRO 100 MG PO CAPS
100.0000 mg | ORAL_CAPSULE | Freq: Every day | ORAL | 2 refills | Status: DC
Start: 2023-08-03 — End: 2023-08-18

## 2023-08-03 MED ORDER — NITROFURANTOIN MONOHYD MACRO 100 MG PO CAPS
100.0000 mg | ORAL_CAPSULE | Freq: Every day | ORAL | 2 refills | Status: DC
Start: 2023-08-03 — End: 2023-08-03

## 2023-08-18 ENCOUNTER — Encounter: Payer: Self-pay | Admitting: Physician Assistant

## 2023-08-18 ENCOUNTER — Ambulatory Visit (INDEPENDENT_AMBULATORY_CARE_PROVIDER_SITE_OTHER): Admitting: Physician Assistant

## 2023-08-18 VITALS — BP 155/74 | HR 93 | Ht 62.0 in | Wt 133.0 lb

## 2023-08-18 DIAGNOSIS — N390 Urinary tract infection, site not specified: Secondary | ICD-10-CM | POA: Diagnosis not present

## 2023-08-18 LAB — MICROSCOPIC EXAMINATION: Epithelial Cells (non renal): >10 /HPF — AB (ref 0–10)

## 2023-08-18 LAB — URINALYSIS, COMPLETE
Bilirubin, UA: NEGATIVE
Glucose, UA: NEGATIVE
Ketones, UA: NEGATIVE
Nitrite, UA: NEGATIVE
Protein,UA: NEGATIVE
RBC, UA: NEGATIVE
Specific Gravity, UA: 1.02 (ref 1.005–1.030)
Urobilinogen, Ur: 0.2 mg/dL (ref 0.2–1.0)
pH, UA: 6 (ref 5.0–7.5)

## 2023-08-18 MED ORDER — NITROFURANTOIN MONOHYD MACRO 100 MG PO CAPS
100.0000 mg | ORAL_CAPSULE | Freq: Every day | ORAL | 1 refills | Status: AC
Start: 2023-08-18 — End: 2024-02-14

## 2023-08-18 MED ORDER — ESTRADIOL 0.1 MG/GM VA CREA
TOPICAL_CREAM | VAGINAL | 2 refills | Status: AC
Start: 2023-08-18 — End: ?

## 2023-08-19 NOTE — Progress Notes (Signed)
 08/18/2023 9:44 AM   Vickie Chaney May 06, 1934 [id number]  CC: Chief Complaint  Patient presents with   Follow-up   HPI: Vickie Chaney is a 89 y.o. female with PMH recurrent UTI on topical vaginal estrogen cream and suppressive Macrobid  x 3 months who presents today for follow-up.   Today she reports no UTI since starting daily Macrobid .  She is tolerating it well and is very pleased.  She wishes to continue it.  She has continued estrogen cream.  In-office UA today positive for trace leukocytes; urine microscopy with >10 epithelial cells/hpf.  PMH: Past Medical History:  Diagnosis Date   Cancer (HCC)    Chicken pox    GERD (gastroesophageal reflux disease)    Skin cancer     Surgical History: Past Surgical History:  Procedure Laterality Date   bone spur     lt foot   BREAST BIOPSY Left 1998   needle   CARDIAC CATHETERIZATION Left 05/30/2015   Procedure: Left Heart Cath and Coronary Angiography;  Surgeon: Vinie DELENA Jude, MD;  Location: [facility] INVASIVE CV LAB;  Service: Cardiovascular;  Laterality: Left;   cataract surgery      Home Medications:  Allergies as of 08/18/2023       Reactions   Sulfamethoxazole-trimethoprim  Nausea Only        Medication List        Accurate as of August 18, 2023 11:59 PM. If you have any questions, ask your nurse or doctor.          STOP taking these medications    hydrocortisone 2.5 % ointment Stopped by: Nitisha Civello   tacrolimus 0.1 % ointment Commonly known as: PROTOPIC Stopped by: Lucie Hones       TAKE these medications    aspirin  81 MG tablet Take 81 mg by mouth daily.   estradiol  0.1 MG/GM vaginal cream Commonly known as: ESTRACE  VAGINAL APPLY A PEA-SIZED AMOUNT TOFINGERTIP AND APPLY TO THE OPENING OF THE URETHRA 3 TIMES WEEKLY. What changed: additional instructions Changed by: Naeema Patlan   glipiZIDE 2.5 MG 24 hr tablet Commonly known as: GLUCOTROL XL Take 2.5 mg by  mouth daily.   hydrochlorothiazide 12.5 MG tablet Commonly known as: HYDRODIURIL Take 12.5 mg by mouth daily.   multivitamin capsule Take 1 capsule by mouth daily.   nitrofurantoin  (macrocrystal-monohydrate) 100 MG capsule Commonly known as: MACROBID  Take 1 capsule (100 mg total) by mouth daily.   simvastatin 10 MG tablet Commonly known as: ZOCOR Take 10 mg by mouth daily.   Vitamin B 12 500 MCG Tabs Take by mouth.        Allergies:  Allergies  Allergen Reactions   Sulfamethoxazole-Trimethoprim  Nausea Only    Family History: Family History  Problem Relation Age of Onset   Breast cancer Neg Hx     Social History:   reports that she has never smoked. She does not have any smokeless tobacco history on file. She reports that she does not drink alcohol and does not use drugs.  Physical Exam: BP (!) 155/74   Pulse 93   Ht 5' 2 (1.575 m)   Wt 133 lb (60.3 kg)   BMI 24.33 kg/m   Constitutional:  Alert and oriented, no acute distress, nontoxic appearing HEENT: [place], AT Cardiovascular: No clubbing, cyanosis, or edema Respiratory: Normal respiratory effort, no increased work of breathing Skin: No rashes, bruises or suspicious lesions Neurologic: Grossly intact, no focal deficits, moving all 4 extremities Psychiatric: Normal mood and affect  Laboratory Data: Results for orders placed or performed in visit on 08/18/23  Microscopic Examination   Collection Time: 08/18/23  2:33 PM   Urine  Result Value Ref Range   WBC, UA 0-5 0 - 5 /hpf   RBC, Urine 0-2 0 - 2 /hpf   Epithelial Cells (non renal) >10 (A) 0 - 10 /hpf   Mucus, UA Present (A) Not Estab.   Bacteria, UA Few None seen/Few  Urinalysis, Complete   Collection Time: 08/18/23  2:33 PM  Result Value Ref Range   Specific Gravity, UA 1.020 1.005 - 1.030   pH, UA 6.0 5.0 - 7.5   Color, UA Yellow Yellow   Appearance Ur Slightly cloudy Clear   Leukocytes,UA Trace (A) Negative   Protein,UA Negative Negative/Trace    Glucose, UA Negative Negative   Ketones, UA Negative Negative   RBC, UA Negative Negative   Bilirubin, UA Negative Negative   Urobilinogen, Ur 0.2 0.2 - 1.0 mg/dL   Nitrite, UA Negative Negative   Microscopic Examination See below:    Assessment & Plan:   1. Recurrent UTI (Primary) Doing well on suppressive Macrobid .  UA today is bland, though contaminated.  We agreed to continue Macrobid  an additional 6 months, at which point we will stop it and I will see her back in clinic thereafter to see how she is doing.  If her infections recur, we can put her back on suppressive therapy. - Urinalysis, Complete - nitrofurantoin , macrocrystal-monohydrate, (MACROBID ) 100 MG capsule; Take 1 capsule (100 mg total) by mouth daily.  Dispense: 90 capsule; Refill: 1 - estradiol  (ESTRACE  VAGINAL) 0.1 MG/GM vaginal cream; APPLY A PEA-SIZED AMOUNT TOFINGERTIP AND APPLY TO THE OPENING OF THE URETHRA 3 TIMES WEEKLY.  Dispense: 42.5 g; Refill: 2   Return for Keep follow-up as scheduled.  Lucie Hones, PA-C  [facility] [place] [address] [place], [place] [address] [phone]

## 2024-03-25 ENCOUNTER — Ambulatory Visit: Payer: Medicare Other | Admitting: Urology

## 2024-04-07 IMAGING — US US RENAL
1 series · 14 of 25 positions shown · non-contrast
Comparison: None Available.

CLINICAL DATA: Recurrent UTI

EXAM:
RENAL / URINARY TRACT ULTRASOUND COMPLETE

[Series 1: us renal · 0.22mm/px · 14 of 36 slices shown]
[im 1/36]
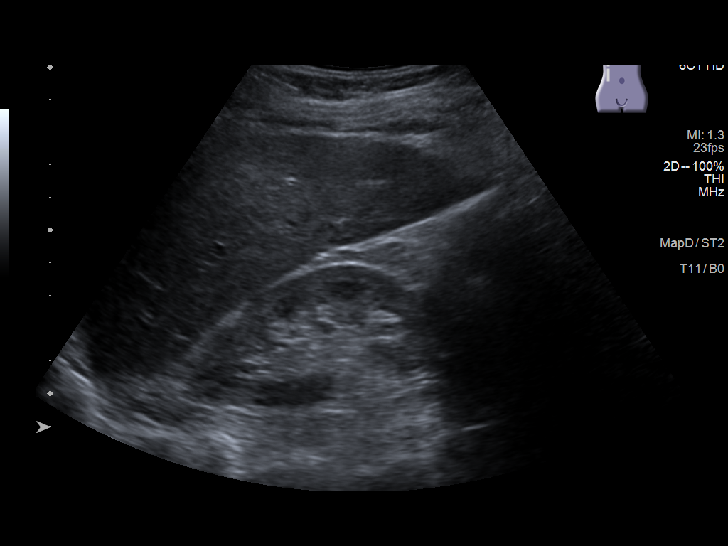
[im 3/36]
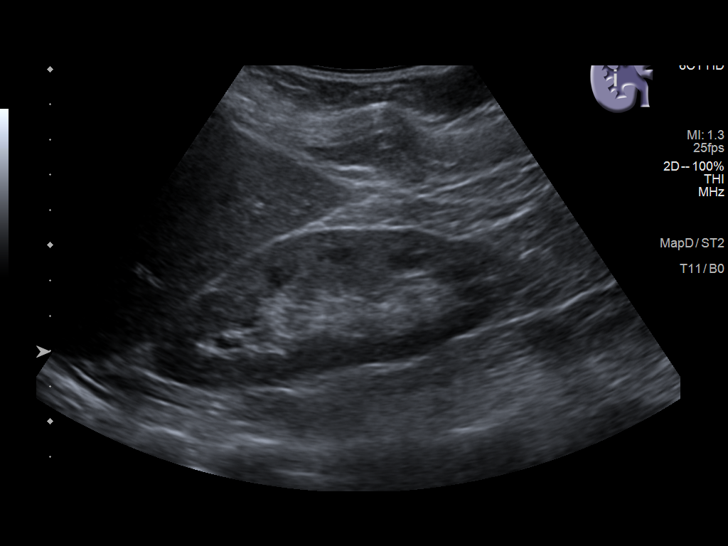
[im 6/36]
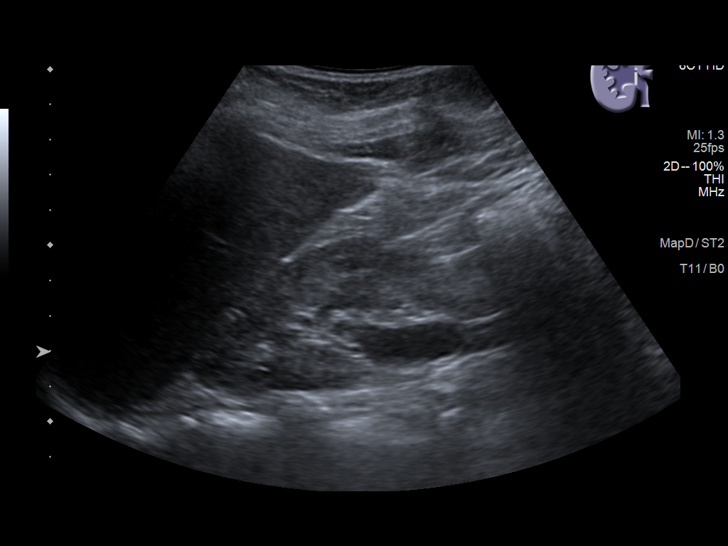
[im 9/36]
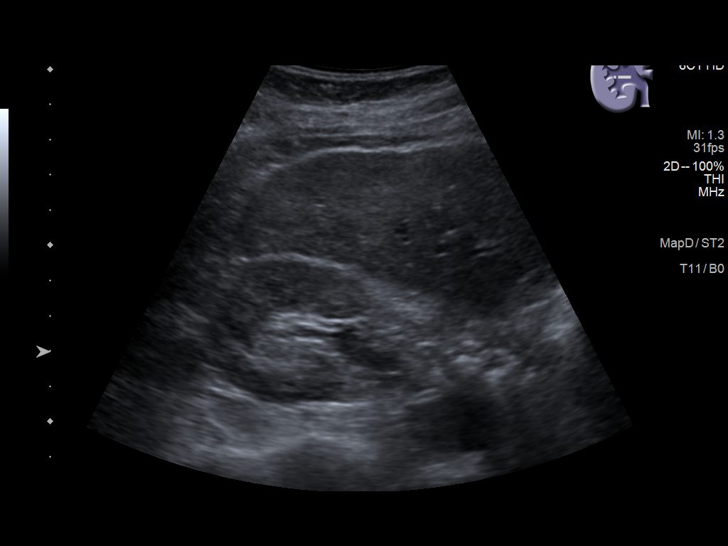
[im 12/36]
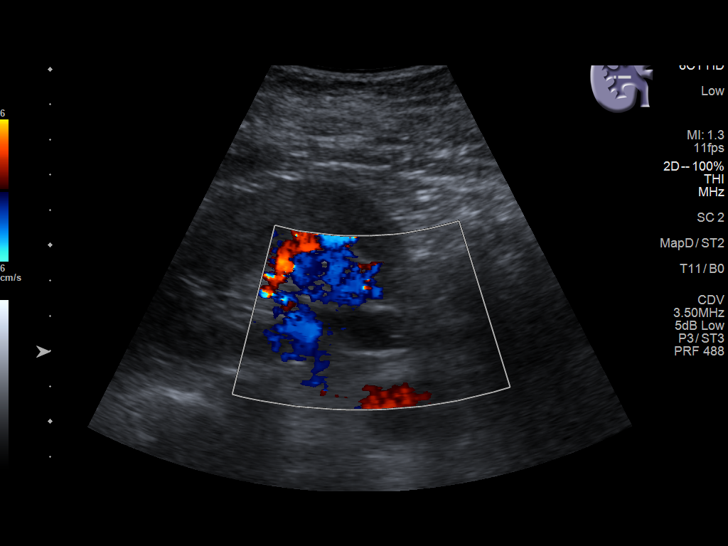
[im 14/36]
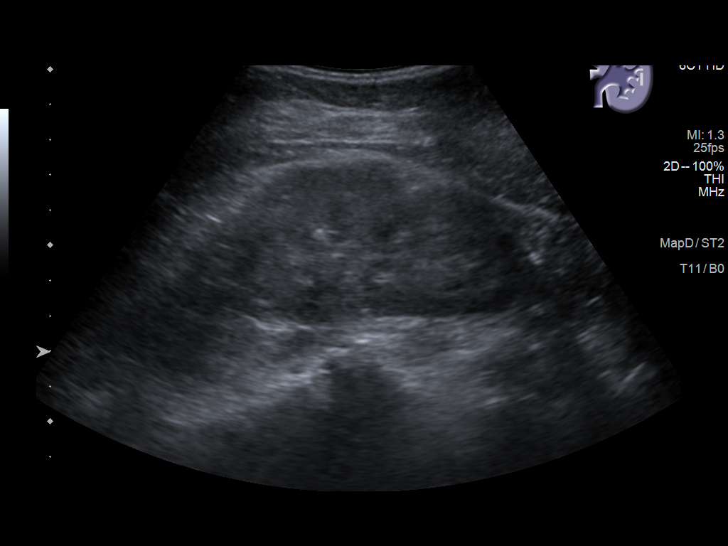
[im 17/36]
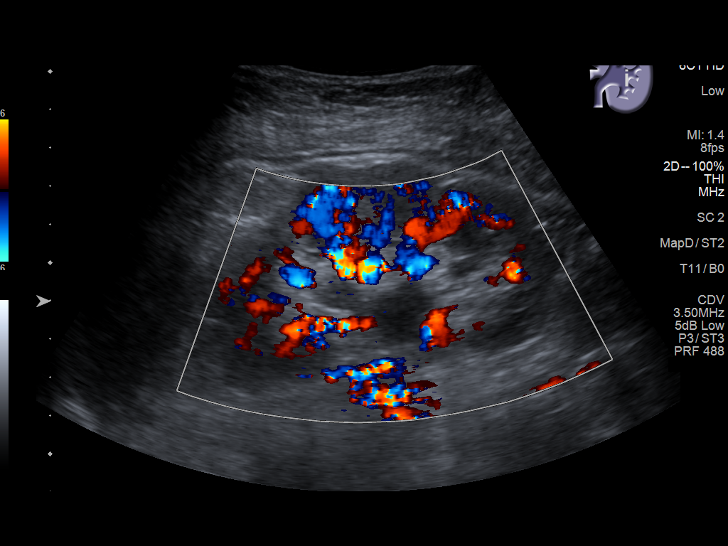
[im 19/36]
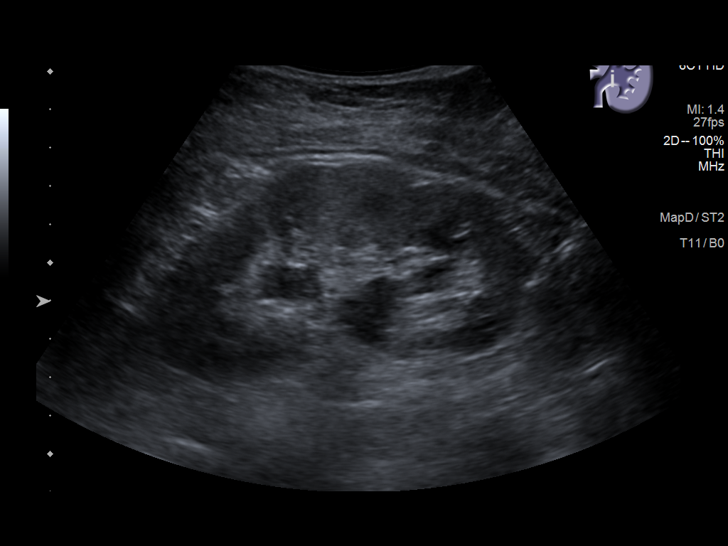
[im 22/36]
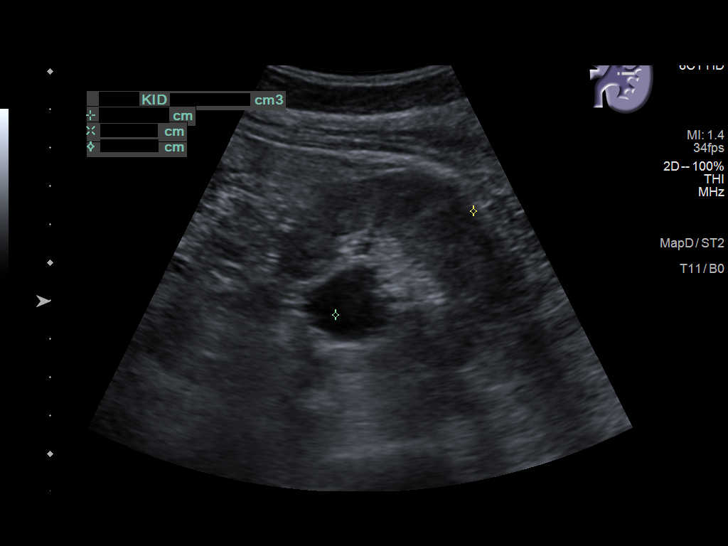
[im 24/36]
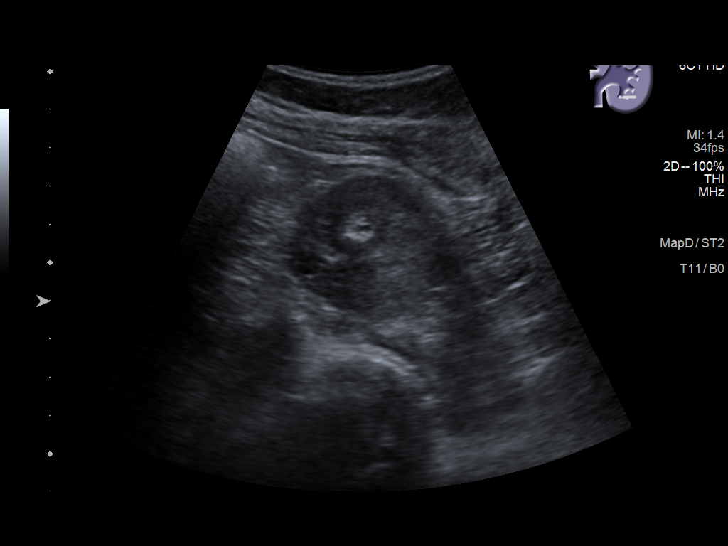
[im 27/36]
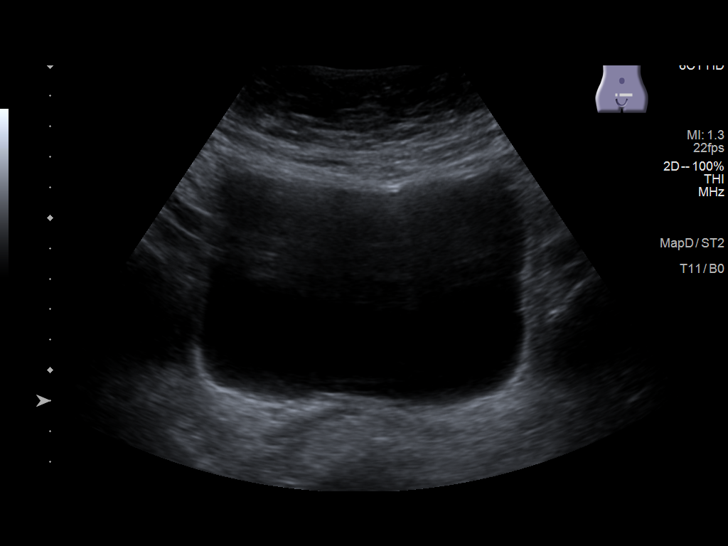
[im 30/36]
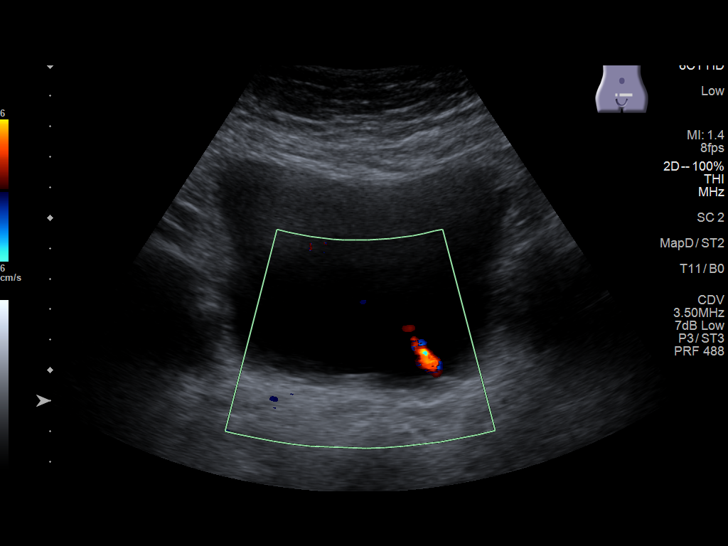
[im 33/36]
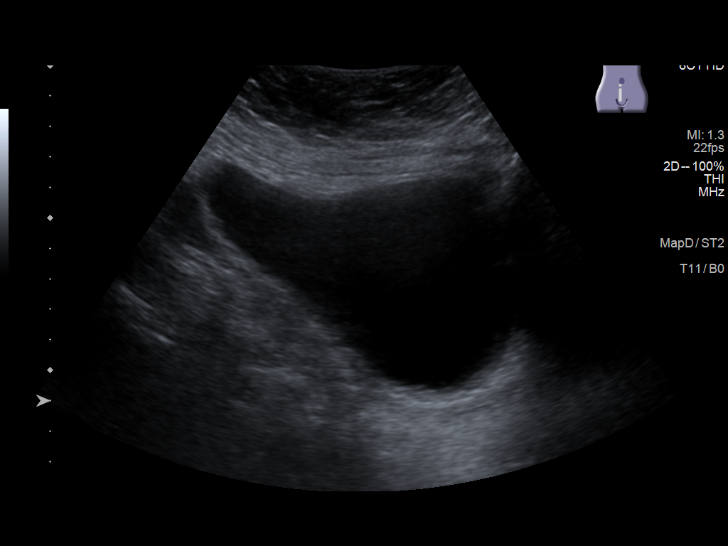
[im 36/36]
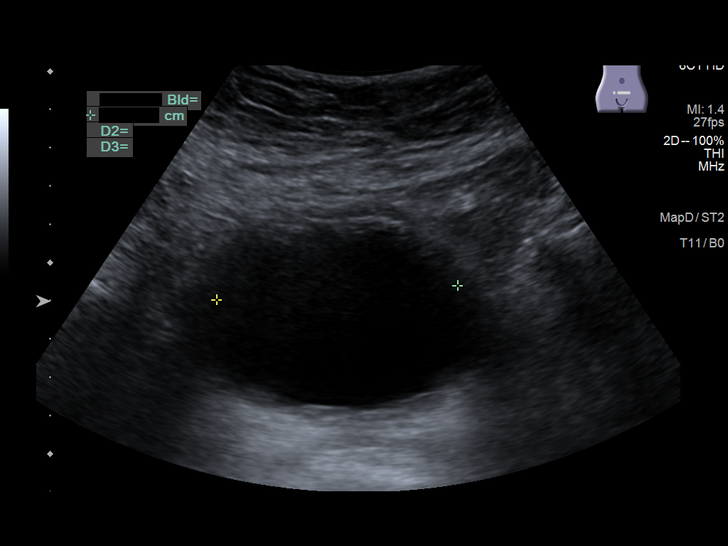

[14 of 25 positions shown; findings below may reference images not displayed]

FINDINGS: Right Kidney:

Renal measurements: 10.5 x 4.0 x 4.1 cm = volume: 8.9 mL. There
appears to be a fluid-filled extrarenal pelvis. No caliectasis.

Left Kidney:

Renal measurements: 10.0 x 4.8 x 4.5 cm = volume: 114 mL. Mild
hydronephrosis.

Bladder:

Appears normal for degree of bladder distention.

Other:

None.
IMPRESSION: 1. Fluid-filled mildly prominent right extrarenal pelvis without
caliectasis.
2. Mild hydronephrosis on the left, age indeterminate.
3. No other abnormalities.
